# Patient Record
Sex: Female | Born: 1975 | Race: White | Hispanic: No | Marital: Married | Smoking: Current every day smoker
Health system: Southern US, Community
[De-identification: ages and names within clinical notes are randomized; demographics above are authoritative.]

## PROBLEM LIST (undated history)

## (undated) DIAGNOSIS — M549 Dorsalgia, unspecified: Secondary | ICD-10-CM

## (undated) DIAGNOSIS — E119 Type 2 diabetes mellitus without complications: Secondary | ICD-10-CM

## (undated) DIAGNOSIS — F32A Depression, unspecified: Secondary | ICD-10-CM

## (undated) DIAGNOSIS — M7989 Other specified soft tissue disorders: Secondary | ICD-10-CM

## (undated) DIAGNOSIS — M2550 Pain in unspecified joint: Secondary | ICD-10-CM

## (undated) DIAGNOSIS — E559 Vitamin D deficiency, unspecified: Secondary | ICD-10-CM

## (undated) DIAGNOSIS — G4730 Sleep apnea, unspecified: Secondary | ICD-10-CM

## (undated) DIAGNOSIS — K5900 Constipation, unspecified: Secondary | ICD-10-CM

## (undated) DIAGNOSIS — K219 Gastro-esophageal reflux disease without esophagitis: Secondary | ICD-10-CM

## (undated) DIAGNOSIS — Z720 Tobacco use: Secondary | ICD-10-CM

## (undated) DIAGNOSIS — E7800 Pure hypercholesterolemia, unspecified: Secondary | ICD-10-CM

## (undated) DIAGNOSIS — J45909 Unspecified asthma, uncomplicated: Secondary | ICD-10-CM

## (undated) DIAGNOSIS — R079 Chest pain, unspecified: Secondary | ICD-10-CM

## (undated) DIAGNOSIS — F419 Anxiety disorder, unspecified: Secondary | ICD-10-CM

## (undated) DIAGNOSIS — I10 Essential (primary) hypertension: Secondary | ICD-10-CM

## (undated) HISTORY — DX: Depression, unspecified: F32.A

## (undated) HISTORY — DX: Sleep apnea, unspecified: G47.30

## (undated) HISTORY — DX: Dorsalgia, unspecified: M54.9

## (undated) HISTORY — DX: Gastro-esophageal reflux disease without esophagitis: K21.9

## (undated) HISTORY — DX: Constipation, unspecified: K59.00

## (undated) HISTORY — DX: Type 2 diabetes mellitus without complications: E11.9

## (undated) HISTORY — DX: Vitamin D deficiency, unspecified: E55.9

## (undated) HISTORY — PX: APPENDECTOMY: SHX54

## (undated) HISTORY — PX: TUBAL LIGATION: SHX77

## (undated) HISTORY — DX: Tobacco use: Z72.0

## (undated) HISTORY — DX: Other specified soft tissue disorders: M79.89

## (undated) HISTORY — DX: Anxiety disorder, unspecified: F41.9

## (undated) HISTORY — PX: CHOLECYSTECTOMY: SHX55

## (undated) HISTORY — DX: Chest pain, unspecified: R07.9

## (undated) HISTORY — DX: Essential (primary) hypertension: I10

## (undated) HISTORY — DX: Pure hypercholesterolemia, unspecified: E78.00

## (undated) HISTORY — DX: Pain in unspecified joint: M25.50

---

## 2001-05-22 ENCOUNTER — Ambulatory Visit (HOSPITAL_COMMUNITY): Admission: RE | Admit: 2001-05-22 | Discharge: 2001-05-22 | Payer: Self-pay | Admitting: Family Medicine

## 2001-05-22 ENCOUNTER — Encounter: Payer: Self-pay | Admitting: Family Medicine

## 2001-12-08 ENCOUNTER — Ambulatory Visit (HOSPITAL_COMMUNITY): Admission: RE | Admit: 2001-12-08 | Discharge: 2001-12-08 | Payer: Self-pay | Admitting: Family Medicine

## 2001-12-08 ENCOUNTER — Encounter: Payer: Self-pay | Admitting: Family Medicine

## 2001-12-28 ENCOUNTER — Ambulatory Visit (HOSPITAL_COMMUNITY): Admission: RE | Admit: 2001-12-28 | Discharge: 2001-12-28 | Payer: Self-pay | Admitting: Family Medicine

## 2001-12-28 ENCOUNTER — Encounter: Payer: Self-pay | Admitting: Family Medicine

## 2002-10-23 ENCOUNTER — Emergency Department (HOSPITAL_COMMUNITY): Admission: EM | Admit: 2002-10-23 | Discharge: 2002-10-24 | Payer: Self-pay | Admitting: *Deleted

## 2002-10-26 ENCOUNTER — Encounter: Payer: Self-pay | Admitting: Family Medicine

## 2002-10-26 ENCOUNTER — Ambulatory Visit (HOSPITAL_COMMUNITY): Admission: RE | Admit: 2002-10-26 | Discharge: 2002-10-26 | Payer: Self-pay | Admitting: Family Medicine

## 2002-11-06 ENCOUNTER — Encounter (HOSPITAL_COMMUNITY): Admission: RE | Admit: 2002-11-06 | Discharge: 2002-12-06 | Payer: Self-pay | Admitting: Neurological Surgery

## 2004-06-17 ENCOUNTER — Ambulatory Visit (HOSPITAL_COMMUNITY): Admission: RE | Admit: 2004-06-17 | Discharge: 2004-06-17 | Payer: Self-pay | Admitting: Family Medicine

## 2004-07-12 ENCOUNTER — Emergency Department (HOSPITAL_COMMUNITY): Admission: EM | Admit: 2004-07-12 | Discharge: 2004-07-13 | Payer: Self-pay | Admitting: Emergency Medicine

## 2005-06-30 ENCOUNTER — Emergency Department (HOSPITAL_COMMUNITY): Admission: EM | Admit: 2005-06-30 | Discharge: 2005-06-30 | Payer: Self-pay | Admitting: Emergency Medicine

## 2007-01-08 ENCOUNTER — Ambulatory Visit: Payer: Self-pay | Admitting: Certified Nurse Midwife

## 2007-01-08 ENCOUNTER — Inpatient Hospital Stay (HOSPITAL_COMMUNITY): Admission: AD | Admit: 2007-01-08 | Discharge: 2007-01-09 | Payer: Self-pay | Admitting: Obstetrics & Gynecology

## 2007-01-26 ENCOUNTER — Ambulatory Visit (HOSPITAL_COMMUNITY): Admission: AD | Admit: 2007-01-26 | Discharge: 2007-01-27 | Payer: Self-pay | Admitting: Obstetrics and Gynecology

## 2009-09-30 ENCOUNTER — Ambulatory Visit: Admission: RE | Admit: 2009-09-30 | Discharge: 2009-09-30 | Payer: Self-pay | Admitting: Family Medicine

## 2010-03-11 ENCOUNTER — Emergency Department (HOSPITAL_COMMUNITY): Admission: EM | Admit: 2010-03-11 | Discharge: 2010-03-12 | Payer: Self-pay | Admitting: Emergency Medicine

## 2010-11-22 LAB — CBC
MCV: 96 fL (ref 78.0–100.0)
Platelets: 308 10*3/uL (ref 150–400)
RDW: 12.9 % (ref 11.5–15.5)
WBC: 11.7 10*3/uL — ABNORMAL HIGH (ref 4.0–10.5)

## 2010-11-22 LAB — DIFFERENTIAL
Basophils Absolute: 0.1 10*3/uL (ref 0.0–0.1)
Eosinophils Relative: 4 % (ref 0–5)
Lymphocytes Relative: 24 % (ref 12–46)
Neutrophils Relative %: 65 % (ref 43–77)

## 2010-11-22 LAB — BASIC METABOLIC PANEL
BUN: 8 mg/dL (ref 6–23)
Calcium: 9.1 mg/dL (ref 8.4–10.5)
Creatinine, Ser: 0.65 mg/dL (ref 0.4–1.2)
GFR calc non Af Amer: >60 mL/min (ref >60)

## 2010-11-22 LAB — POCT CARDIAC MARKERS: CKMB, poc: 1.3 ng/mL (ref 1.0–8.0)

## 2010-11-22 LAB — D-DIMER, QUANTITATIVE: D-Dimer, Quant: 0.22 ug/mL-FEU (ref 0.00–0.48)

## 2012-09-07 ENCOUNTER — Emergency Department (HOSPITAL_COMMUNITY)
Admission: EM | Admit: 2012-09-07 | Discharge: 2012-09-07 | Disposition: A | Discharge status: Home / Self Care | Payer: 59 | Attending: Emergency Medicine | Admitting: Emergency Medicine

## 2012-09-07 ENCOUNTER — Encounter (HOSPITAL_COMMUNITY): Payer: Self-pay | Admitting: *Deleted

## 2012-09-07 ENCOUNTER — Emergency Department (HOSPITAL_COMMUNITY): Payer: 59

## 2012-09-07 DIAGNOSIS — M79671 Pain in right foot: Secondary | ICD-10-CM

## 2012-09-07 DIAGNOSIS — J45909 Unspecified asthma, uncomplicated: Secondary | ICD-10-CM | POA: Insufficient documentation

## 2012-09-07 DIAGNOSIS — M25579 Pain in unspecified ankle and joints of unspecified foot: Secondary | ICD-10-CM | POA: Insufficient documentation

## 2012-09-07 DIAGNOSIS — F17200 Nicotine dependence, unspecified, uncomplicated: Secondary | ICD-10-CM | POA: Insufficient documentation

## 2012-09-07 HISTORY — DX: Unspecified asthma, uncomplicated: J45.909

## 2012-09-07 MED ORDER — IBUPROFEN 800 MG PO TABS
800.0000 mg | ORAL_TABLET | Freq: Once | ORAL | Status: AC
  Administered 2012-09-07: 800 mg via ORAL
  Filled 2012-09-07: qty 1

## 2012-09-07 MED ORDER — IBUPROFEN 600 MG PO TABS: 600.0000 mg | ORAL_TABLET | Freq: Four times a day (QID) | ORAL | Status: DC | PRN

## 2012-09-07 NOTE — ED Notes (Signed)
Pain rt foot ,no known injury, Increased pain with wt bearing.

## 2012-09-07 NOTE — ED Provider Notes (Signed)
Medical screening examination/treatment/procedure(s) were performed by non-physician practitioner and as supervising physician I was immediately available for consultation/collaboration. Bexley Mclester, MD, FACEP   Leeasia Secrist L Lauralye Kinn, MD 09/07/12 2345 

## 2012-09-07 NOTE — ED Provider Notes (Signed)
History     CSN: [id number]  Arrival date & time 09/07/12  2156   First MD Initiated Contact with Patient 09/07/12 2211      Chief Complaint  Patient presents with  . Foot Pain    (Consider location/radiation/quality/duration/timing/severity/associated sxs/prior treatment) HPI Comments: Vanessa Shelton presents with a 1 day history of right foot pain located at her instep,  Described as constant,  nonradiating and burning in character,  Worse with weight bearing and palpation.  She denies injury.  She does stand on her feet all day with her job,  And started wearing a new pair of tennis shoe about 2 weeks ago ,  But are comfortable and does not feel this is the problem.  She has taken no medicines for her pain today,  But did try to rest and keep it elevated last night which did not help.    The history is provided by the patient.    Past Medical History  Diagnosis Date  . Asthma     Past Surgical History  Procedure Date  . Appendectomy   . Cholecystectomy   . Tubal ligation     History reviewed. No pertinent family history.  History  Substance Use Topics  . Smoking status: Current Every Day Smoker  . Smokeless tobacco: Not on file  . Alcohol Use: No    OB History    Grav Para Term Preterm Abortions TAB SAB Ect Mult Living                  Review of Systems  Musculoskeletal: Positive for arthralgias. Negative for joint swelling.  Skin: Negative for wound.  Neurological: Negative for weakness and numbness.    Allergies  Bee venom; Penicillins; and Latex  Home Medications   Current Outpatient Rx  Name  Route  Sig  Dispense  Refill  . IBUPROFEN 600 MG PO TABS   Oral   Take 1 tablet (600 mg total) by mouth every 6 (six) hours as needed for pain.   30 tablet   0     BP 151/76  Pulse 88  Temp 97.9 F (36.6 C) (Oral)  Resp 24  Ht 5\' 4"  (1.626 m)  Wt 225 lb 6 oz (102.229 kg)  BMI 38.69 kg/m2  SpO2 99%  LMP 08/11/2012  Physical Exam  Constitutional:  She appears well-developed and well-nourished.  HENT:  Head: Atraumatic.  Neck: Normal range of motion.  Musculoskeletal: She exhibits tenderness.       Feet:       Localized ttp right instep,  Faint ecchymosis at site.  Distal sensation intact with less than 3 sec cap refill.  Pedal pulses intact.  Neurological: She is alert. She has normal strength. She displays normal reflexes. No sensory deficit.       Equal strength  Skin: Skin is warm and dry.  Psychiatric: She has a normal mood and affect.    ED Course  Procedures (including critical care time)  Labs Reviewed - No data to display Dg Foot Complete Right  09/07/2012  *RADIOLOGY REPORT*  Clinical Data: Pain and swelling at the arch of the right foot.  RIGHT FOOT COMPLETE - 3+ VIEW  Comparison: None.  Findings: There is no evidence of fracture or dislocation.  The joint spaces are preserved.  There is no evidence of talar subluxation; the subtalar joint is unremarkable in appearance.  No significant soft tissue abnormalities are seen.  IMPRESSION: No evidence of fracture or dislocation.  Original Report Authenticated By: Tonia Ghent, M.D.      1. Right foot pain       MDM  Ibuprofen, ice/elevation.  Ace wrap provided.  Recheck by pcp if not improved over the next week.        Burgess Amor, [place] 09/07/12 2259

## 2012-12-12 ENCOUNTER — Encounter: Payer: Self-pay | Admitting: *Deleted

## 2012-12-14 ENCOUNTER — Encounter: Payer: Self-pay | Admitting: Nurse Practitioner

## 2012-12-14 ENCOUNTER — Other Ambulatory Visit: Payer: Self-pay | Admitting: Nurse Practitioner

## 2012-12-14 ENCOUNTER — Ambulatory Visit (INDEPENDENT_AMBULATORY_CARE_PROVIDER_SITE_OTHER): Payer: 59 | Admitting: Nurse Practitioner

## 2012-12-14 VITALS — BP 130/80 | HR 80 | Ht 64.5 in | Wt 234.2 lb

## 2012-12-14 DIAGNOSIS — Z13220 Encounter for screening for lipoid disorders: Secondary | ICD-10-CM

## 2012-12-14 DIAGNOSIS — Z01419 Encounter for gynecological examination (general) (routine) without abnormal findings: Secondary | ICD-10-CM

## 2012-12-14 DIAGNOSIS — R5381 Other malaise: Secondary | ICD-10-CM

## 2012-12-14 DIAGNOSIS — Z0000 Encounter for general adult medical examination without abnormal findings: Secondary | ICD-10-CM

## 2012-12-14 DIAGNOSIS — Z124 Encounter for screening for malignant neoplasm of cervix: Secondary | ICD-10-CM

## 2012-12-14 DIAGNOSIS — R5383 Other fatigue: Secondary | ICD-10-CM

## 2012-12-14 LAB — HEPATIC FUNCTION PANEL
ALT: 45 U/L — ABNORMAL HIGH (ref 0–35)
Bilirubin, Direct: 0.1 mg/dL (ref 0.0–0.3)
Total Protein: 6.9 g/dL (ref 6.0–8.3)

## 2012-12-14 LAB — BASIC METABOLIC PANEL
BUN: 10 mg/dL (ref 6–23)
CO2: 27 mEq/L (ref 19–32)
Chloride: 104 mEq/L (ref 96–112)
Creat: 0.59 mg/dL (ref 0.50–1.10)

## 2012-12-14 LAB — LIPID PANEL
Cholesterol: 156 mg/dL (ref 0–200)
Triglycerides: 96 mg/dL (ref <150)

## 2012-12-15 LAB — PAP IG W/ RFLX HPV ASCU

## 2012-12-16 ENCOUNTER — Encounter: Payer: Self-pay | Admitting: Nurse Practitioner

## 2012-12-16 ENCOUNTER — Telehealth: Payer: Self-pay | Admitting: Nurse Practitioner

## 2012-12-16 DIAGNOSIS — R5383 Other fatigue: Secondary | ICD-10-CM | POA: Insufficient documentation

## 2012-12-16 DIAGNOSIS — R0683 Snoring: Secondary | ICD-10-CM

## 2012-12-16 DIAGNOSIS — Z01419 Encounter for gynecological examination (general) (routine) without abnormal findings: Secondary | ICD-10-CM | POA: Insufficient documentation

## 2012-12-16 MED ORDER — VITAMIN D (ERGOCALCIFEROL) 1.25 MG (50000 UNIT) PO CAPS: 50000.0000 [IU] | ORAL_CAPSULE | ORAL | Status: DC

## 2012-12-16 NOTE — Progress Notes (Signed)
Subjective:  Presents for her preventive health physical. Gets regular eye exams. Has had endometrial ablation but continues to have regular menses with clotting at times slightly heavy flow lasting 5-7 days. Some pain. No vaginal discharge. Married, same sexual partner. Very active job but otherwise no exercise. Has excessive fatigue during the daytime, husband says she has very loud snoring with gasping at times. Patient quit smoking in January, has gained some weight since then.  Objective:   BP 130/80  Pulse 80  Ht 5' 4.5" (1.638 m)  Wt 234 lb 4 oz (106.255 kg)  BMI 39.6 kg/m2 NAD. Alert, oriented. Mildly fatigued in appearance. TMs mild clear effusion, no erythema. Pharynx clear. Neck supple with minimal adenopathy. Thyroid normal limit to palpation. Lungs clear. Heart regular rate rhythm. Breast exam normal limit, axilla no adenopathy. Abdomen soft nondistended nontender. EGBUS normal. Vagina normal limit in appearance. Pap smear obtained. Bimanual exam no tenderness, no masses. Limited exam due to abdominal girth.

## 2012-12-16 NOTE — Assessment & Plan Note (Signed)
With patient's weight gain, excessive fatigue and history of snoring, will set up study for OSA.

## 2012-12-16 NOTE — Telephone Encounter (Signed)
Vitamin D level low on recent labs.  Sleep study ordered see note 12/14/2012

## 2012-12-16 NOTE — Assessment & Plan Note (Signed)
Discussed importance of healthy diet and weight loss. Continued encouragement for smoking cessation efforts. Routine lab work ordered. Pap smear pending. Recommend vitamin D and calcium supplementation.

## 2012-12-22 ENCOUNTER — Telehealth: Payer: Self-pay | Admitting: Family Medicine

## 2012-12-22 NOTE — Telephone Encounter (Signed)
I apologize.  I remember her telling me this.  Please refer to [facility] for new CPAP.  Orders need to be for:  Variable CPAP with humidified H2O 5-20cm. Mask/tubing of choice.  Diagnosis:  OSA.  Please fax order and copy of sleep study to [facility] at [phone]

## 2012-12-22 NOTE — Telephone Encounter (Signed)
Patient states you told her we would work on getting her an auto titrate CPAP machine with new tubing and possibly some sort of Nasal Pillow instead of a mask.  Then she got a call from [facility] about getting a new sleep study.  She just had a sleep study & got her CPAP machine in 2011.  She's confused as to need for new sleep study, unsure insurance will cover new test so soon, please advise.  We may leave a message on her cell# [phone] or call her at work [phone]

## 2012-12-25 NOTE — Telephone Encounter (Signed)
Order and copy of sleep study faxed to number listed below. Patient notified.

## 2012-12-26 ENCOUNTER — Telehealth: Payer: Self-pay | Admitting: Family Medicine

## 2012-12-26 NOTE — Telephone Encounter (Signed)
[facility] sent a fax explaining that they can't provide patient with auto CPAP due to being out of network with patient's insurance.  I called [facility] and that's where patient has current CPAP through, they requested me fax the order and they will contact patient.

## 2013-01-04 ENCOUNTER — Telehealth: Payer: Self-pay | Admitting: *Deleted

## 2013-01-04 NOTE — Telephone Encounter (Signed)
Left message to Pt via vm regarding [facility] home sleep study. Attempts by [facility] to contact pt several times, no answer or call back.

## 2013-07-12 ENCOUNTER — Other Ambulatory Visit: Payer: Self-pay

## 2013-08-03 ENCOUNTER — Encounter: Payer: Self-pay | Admitting: Family Medicine

## 2013-08-03 ENCOUNTER — Ambulatory Visit (INDEPENDENT_AMBULATORY_CARE_PROVIDER_SITE_OTHER): Payer: 59 | Admitting: Family Medicine

## 2013-08-03 ENCOUNTER — Telehealth: Payer: Self-pay | Admitting: Family Medicine

## 2013-08-03 VITALS — BP 142/90 | Temp 98.4°F | Ht 64.0 in | Wt 229.4 lb

## 2013-08-03 DIAGNOSIS — J209 Acute bronchitis, unspecified: Secondary | ICD-10-CM

## 2013-08-03 MED ORDER — ALBUTEROL SULFATE HFA 108 (90 BASE) MCG/ACT IN AERS: 2.0000 | INHALATION_SPRAY | Freq: Four times a day (QID) | RESPIRATORY_TRACT | Status: DC | PRN

## 2013-08-03 MED ORDER — AZITHROMYCIN 250 MG PO TABS: ORAL_TABLET | ORAL | Status: DC

## 2013-08-03 NOTE — Telephone Encounter (Signed)
Inhalers are all extremely pricey thanks to insur companies--written in a way that they can do any generic albuterol, call [facility] and ask if cheaper alternative

## 2013-08-03 NOTE — Telephone Encounter (Signed)
Spoke with [facility]-There is no cheaper alternative. Patient notified.

## 2013-08-03 NOTE — Telephone Encounter (Signed)
Patient seen here this afternoon, inhaler you wrote for her is $50.00 with her copay on her insurance, can't afford, can we call in something cheaper?  Please advise Pt uses [facility], please call pt [phone]

## 2013-08-03 NOTE — Progress Notes (Signed)
   Subjective:    Patient ID: Vanessa Shelton, female    DOB: 1975-11-12, 37 y.o.   MRN: [id number]  Cough This is a new problem. The current episode started 1 to 4 weeks ago. The problem occurs every few minutes. The cough is productive of brown sputum and productive of blood-tinged sputum. Associated symptoms include chest pain, ear pain, headaches, myalgias, postnasal drip and rhinorrhea. She has tried OTC cough suppressant for the symptoms. The treatment provided no relief.   cou Cough has been going on for two wks, brownish color  Productive cough, no notieable fever  Feeling tight, and low back pain with cough  Pt quit in [place], back to it in the summer  Review of Systems  HENT: Positive for ear pain, postnasal drip and rhinorrhea.   Respiratory: Positive for cough.   Cardiovascular: Positive for chest pain.  Musculoskeletal: Positive for myalgias.  Neurological: Positive for headaches.       Objective:   Physical Exam Alert HEENT moderate nasal congestion. Some malaise. Blood pressure good on repeat. Vital stable. Pharynx slight erythema lungs mild wheezes some rhonchi no tachypnea no crackles heart regular rate and rhythm       Assessment & Plan:  Impression acute bronchitis with reactive airways. Plan appropriate antibiotics. Albuterol when necessary. Symptomatic care discussed. WSL

## 2013-11-01 ENCOUNTER — Telehealth: Payer: Self-pay | Admitting: Family Medicine

## 2013-11-01 ENCOUNTER — Other Ambulatory Visit: Payer: Self-pay | Admitting: Family Medicine

## 2013-11-01 MED ORDER — SULFACETAMIDE SODIUM 10 % OP SOLN: 2.0000 [drp] | Freq: Four times a day (QID) | OPHTHALMIC | Status: DC

## 2013-11-01 NOTE — Telephone Encounter (Signed)
Pt called, eye itchy, green d/c last pm, clear today, no injury, no fever, [facility], pt will get filled if Sx worse, pt to call if not better within a few days, aslo if worse to call

## 2014-01-06 ENCOUNTER — Emergency Department (HOSPITAL_COMMUNITY)
Admission: EM | Admit: 2014-01-06 | Discharge: 2014-01-06 | Disposition: A | Discharge status: Home / Self Care | Payer: 59 | Attending: Emergency Medicine | Admitting: Emergency Medicine

## 2014-01-06 ENCOUNTER — Encounter (HOSPITAL_COMMUNITY): Payer: Self-pay | Admitting: Emergency Medicine

## 2014-01-06 DIAGNOSIS — J45909 Unspecified asthma, uncomplicated: Secondary | ICD-10-CM | POA: Insufficient documentation

## 2014-01-06 DIAGNOSIS — F17200 Nicotine dependence, unspecified, uncomplicated: Secondary | ICD-10-CM | POA: Insufficient documentation

## 2014-01-06 DIAGNOSIS — Z79899 Other long term (current) drug therapy: Secondary | ICD-10-CM | POA: Insufficient documentation

## 2014-01-06 DIAGNOSIS — Z91040 Latex allergy status: Secondary | ICD-10-CM | POA: Insufficient documentation

## 2014-01-06 DIAGNOSIS — R519 Headache, unspecified: Secondary | ICD-10-CM

## 2014-01-06 DIAGNOSIS — Z792 Long term (current) use of antibiotics: Secondary | ICD-10-CM | POA: Insufficient documentation

## 2014-01-06 DIAGNOSIS — R51 Headache: Secondary | ICD-10-CM | POA: Insufficient documentation

## 2014-01-06 DIAGNOSIS — Z880 Allergy status to penicillin: Secondary | ICD-10-CM | POA: Insufficient documentation

## 2014-01-06 MED ORDER — KETOROLAC TROMETHAMINE 10 MG PO TABS
10.0000 mg | ORAL_TABLET | Freq: Once | ORAL | Status: AC
  Administered 2014-01-06: 10 mg via ORAL
  Filled 2014-01-06: qty 1

## 2014-01-06 MED ORDER — KETOROLAC TROMETHAMINE 10 MG PO TABS: 10.0000 mg | ORAL_TABLET | Freq: Three times a day (TID) | ORAL | Status: DC

## 2014-01-06 MED ORDER — HYDROCODONE-ACETAMINOPHEN 5-325 MG PO TABS: 1.0000 | ORAL_TABLET | ORAL | Status: DC | PRN

## 2014-01-06 MED ORDER — HYDROCODONE-ACETAMINOPHEN 5-325 MG PO TABS
2.0000 | ORAL_TABLET | Freq: Once | ORAL | Status: AC
  Administered 2014-01-06: 2 via ORAL
  Filled 2014-01-06: qty 2

## 2014-01-06 MED ORDER — CLINDAMYCIN HCL 150 MG PO CAPS
300.0000 mg | ORAL_CAPSULE | Freq: Once | ORAL | Status: AC
  Administered 2014-01-06: 300 mg via ORAL
  Filled 2014-01-06: qty 2

## 2014-01-06 MED ORDER — CLINDAMYCIN HCL 150 MG PO CAPS: 150.0000 mg | ORAL_CAPSULE | Freq: Three times a day (TID) | ORAL | Status: DC

## 2014-01-06 NOTE — Discharge Instructions (Signed)
Your vital signs and electrocardiogram are all normal. I suspect that you are having a problem with your sinuses, or with your dental status. Please see your dentist as sone as possible for evaluation. Please use clindamycin and Toradol daily. Please take these with food. May use Norco for pain if needed. This medication may cause drowsiness, please use with caution.

## 2014-01-06 NOTE — ED Provider Notes (Signed)
CSN: [id number]     Arrival date & time 01/06/14  [id number] History   First MD Initiated Contact with Patient 01/06/14 0848     Chief Complaint  Patient presents with  . Jaw Pain     (Consider location/radiation/quality/duration/timing/severity/associated sxs/prior Treatment) HPI Comments: Pt states she was awaken with right side face pain. No reported injury to the right face. No known insect bite. No recent procedure or surgery involving the face. No reported sinus issues. No reported fever or chills. Pt does c/o nausea related to the pain. She has not had this pain before. No c/o chest pain or SOB related to the face pain. She took tylenol at 3AM and 6AM without relief.  The history is provided by the patient.    Past Medical History  Diagnosis Date  . Asthma    Past Surgical History  Procedure Laterality Date  . Appendectomy    . Cholecystectomy    . Tubal ligation     Family History  Problem Relation Age of Onset  . Hypertension Maternal Grandmother   . Heart attack Maternal Grandfather    History  Substance Use Topics  . Smoking status: Current Every Day Smoker  . Smokeless tobacco: Not on file  . Alcohol Use: No   OB History   Grav Para Term Preterm Abortions TAB SAB Ect Mult Living                 Review of Systems  Constitutional: Negative for activity change.       All ROS Neg except as noted in HPI  HENT: Negative for nosebleeds.   Eyes: Negative for photophobia and discharge.  Respiratory: Negative for cough, shortness of breath and wheezing.   Cardiovascular: Negative for chest pain and palpitations.  Gastrointestinal: Negative for abdominal pain and blood in stool.  Genitourinary: Negative for dysuria, frequency and hematuria.  Musculoskeletal: Negative for arthralgias, back pain and neck pain.  Skin: Negative.   Neurological: Negative for dizziness, seizures and speech difficulty.  Psychiatric/Behavioral: Negative for hallucinations and confusion.       Allergies  Bee venom; Penicillins; and Latex  Home Medications   Prior to Admission medications   Medication Sig Start Date End Date Taking? Authorizing Provider  albuterol (PROVENTIL HFA;VENTOLIN HFA) 108 (90 BASE) MCG/ACT inhaler Inhale 2 puffs into the lungs every 6 (six) hours as needed for wheezing or shortness of breath. 08/03/13   Merlyn AlbertWilliam S Luking, MD  azithromycin (ZITHROMAX Z-PAK) 250 MG tablet Take 2 tablets (500 mg) on  Day 1,  followed by 1 tablet (250 mg) once daily on Days 2 through 5. 08/03/13   Merlyn AlbertWilliam S Luking, MD  Homeopathic Products (ALLERGY MEDICINE PO) Take by mouth daily.    Historical Provider, MD  ibuprofen (ADVIL,MOTRIN) 600 MG tablet Take 1 tablet (600 mg total) by mouth every 6 (six) hours as needed for pain. 09/07/12   Burgess AmorJulie Idol, PA-C  sulfacetamide (BLEPH-10) 10 % ophthalmic solution Place 2 drops into the left eye 4 (four) times daily. 11/01/13   Babs SciaraScott A Luking, MD  Vitamin D, Ergocalciferol, (DRISDOL) 50000 UNITS CAPS Take 1 capsule (50,000 Units total) by mouth every 7 (seven) days. 12/16/12   Campbell Richesarolyn C Hoskins, NP   BP 132/78  Pulse 85  Temp(Src) 97.9 F (36.6 C) (Oral)  Resp 18  Ht 5\' 3"  (1.6 m)  Wt 229 lb (103.874 kg)  BMI 40.58 kg/m2  SpO2 97%  LMP 01/06/2014 Physical Exam  Nursing note and vitals reviewed.  Constitutional: She is oriented to person, place, and time. She appears well-developed and well-nourished.  Non-toxic appearance.  HENT:  Head: Normocephalic.  Right Ear: Tympanic membrane and external ear normal.  Left Ear: Tympanic membrane and external ear normal.  There is no significant swelling of the face. The patient head are symmetrical. There is mild tenderness to percussion over the right maxillary sinus.  It is also pain to percussion of the right lower jaw. No rash noted of the face.  There is significant gum disease present. There is no visible abscess. The airway is patent. The uvula is in the midline. There is no swelling  under the tongue. No unusual rash in the mouth.  Eyes: EOM and lids are normal. Pupils are equal, round, and reactive to light.  Neck: Normal range of motion. Neck supple. Carotid bruit is not present.  Cardiovascular: Normal rate, regular rhythm, normal heart sounds, intact distal pulses and normal pulses.  Exam reveals no gallop and no friction rub.   No murmur heard. Pulmonary/Chest: Breath sounds normal. No respiratory distress. She has no wheezes. She has no rales.  Abdominal: Soft. Bowel sounds are normal. There is no tenderness. There is no guarding.  Musculoskeletal: Normal range of motion. She exhibits no edema.  Lymphadenopathy:       Head (right side): No submandibular adenopathy present.       Head (left side): No submandibular adenopathy present.    She has no cervical adenopathy.  Neurological: She is alert and oriented to person, place, and time. She has normal strength. No cranial nerve deficit or sensory deficit.  Skin: Skin is warm and dry.  Psychiatric: She has a normal mood and affect. Her speech is normal.    ED Course  Procedures (including critical care time) Labs Review Labs Reviewed - No data to display  Imaging Review No results found.   EKG Interpretation None      MDM Vital signs are well within normal limits. The pulse oximetry is 97% on room air. Within normal limits by my interpretation. Electrocardiogram shows a normal sinus rhythm. No acute event at this time.  I suspect the patient is a has a sinus related condition, or problems with her dentition. Patient will be placed on clindamycin, Toradol, and Norco. Patient strongly advised to see a dentist as sone as possible for evaluation of this facial pain. Patient is invited to return to the emergency department if any changes or deterioration in condition.    Final diagnoses:  None    **I have reviewed nursing notes, vital signs, and all appropriate lab and imaging results for this  patient.Kathie Dike*    Alyxandra Tenbrink M Solae Norling, PA-C 01/06/14 1035

## 2014-01-06 NOTE — ED Notes (Signed)
Pt reports woke up at 3 am with pain and swelling to r side of face.  Took 1 tylenol 3 at 3am and 1 at 6am.

## 2014-01-08 NOTE — ED Provider Notes (Signed)
Medical screening examination/treatment/procedure(s) were performed by non-physician practitioner and as supervising physician I was immediately available for consultation/collaboration.   EKG Interpretation   Date/Time:  Sunday Jan 06 2014 10:14:32 EDT Ventricular Rate:  64 PR Interval:  164 QRS Duration: 82 QT Interval:  404 QTC Calculation: 416 R Axis:   64 Text Interpretation:  Normal sinus rhythm with sinus arrhythmia Normal ECG  ED PHYSICIAN INTERPRETATION AVAILABLE IN [facility] Confirmed by  TEST, Record ([id number]) on 01/08/2014 7:24:21 AM       Juliet RudeNathan R. Rubin PayorPickering, MD 01/08/14 1208

## 2014-09-23 ENCOUNTER — Other Ambulatory Visit (HOSPITAL_COMMUNITY): Payer: Self-pay | Admitting: Nephrology

## 2014-09-23 ENCOUNTER — Ambulatory Visit (HOSPITAL_COMMUNITY)
Admission: RE | Admit: 2014-09-23 | Discharge: 2014-09-23 | Disposition: A | Discharge status: Home / Self Care | Payer: Managed Care, Other (non HMO) | Source: Ambulatory Visit | Attending: Nephrology | Admitting: Nephrology

## 2014-09-23 DIAGNOSIS — A150 Tuberculosis of lung: Secondary | ICD-10-CM

## 2015-02-05 ENCOUNTER — Encounter (HOSPITAL_COMMUNITY): Payer: Self-pay | Admitting: Emergency Medicine

## 2015-02-05 ENCOUNTER — Emergency Department (HOSPITAL_COMMUNITY)
Admission: EM | Admit: 2015-02-05 | Discharge: 2015-02-05 | Disposition: A | Discharge status: Home / Self Care | Payer: Managed Care, Other (non HMO) | Attending: Emergency Medicine | Admitting: Emergency Medicine

## 2015-02-05 DIAGNOSIS — W57XXXA Bitten or stung by nonvenomous insect and other nonvenomous arthropods, initial encounter: Secondary | ICD-10-CM | POA: Insufficient documentation

## 2015-02-05 DIAGNOSIS — S80261A Insect bite (nonvenomous), right knee, initial encounter: Secondary | ICD-10-CM | POA: Diagnosis present

## 2015-02-05 DIAGNOSIS — Z720 Tobacco use: Secondary | ICD-10-CM | POA: Diagnosis not present

## 2015-02-05 DIAGNOSIS — Y9289 Other specified places as the place of occurrence of the external cause: Secondary | ICD-10-CM | POA: Insufficient documentation

## 2015-02-05 DIAGNOSIS — Z91040 Latex allergy status: Secondary | ICD-10-CM | POA: Diagnosis not present

## 2015-02-05 DIAGNOSIS — Z880 Allergy status to penicillin: Secondary | ICD-10-CM | POA: Diagnosis not present

## 2015-02-05 DIAGNOSIS — Z792 Long term (current) use of antibiotics: Secondary | ICD-10-CM | POA: Diagnosis not present

## 2015-02-05 DIAGNOSIS — J45909 Unspecified asthma, uncomplicated: Secondary | ICD-10-CM | POA: Diagnosis not present

## 2015-02-05 DIAGNOSIS — Y9389 Activity, other specified: Secondary | ICD-10-CM | POA: Insufficient documentation

## 2015-02-05 DIAGNOSIS — Y998 Other external cause status: Secondary | ICD-10-CM | POA: Diagnosis not present

## 2015-02-05 DIAGNOSIS — Z791 Long term (current) use of non-steroidal anti-inflammatories (NSAID): Secondary | ICD-10-CM | POA: Insufficient documentation

## 2015-02-05 MED ORDER — TRIAMCINOLONE ACETONIDE 0.1 % EX CREA: 1.0000 "application " | TOPICAL_CREAM | Freq: Two times a day (BID) | CUTANEOUS | Status: DC

## 2015-02-05 MED ORDER — SULFAMETHOXAZOLE-TRIMETHOPRIM 800-160 MG PO TABS: 1.0000 | ORAL_TABLET | Freq: Two times a day (BID) | ORAL | Status: AC

## 2015-02-05 NOTE — Discharge Instructions (Signed)
Please soak in a double warm water for 10-15 minutes daily until the infected area under the knee resolves. Please use Septra 2 times daily with food. Please apply triamcinolone cream 2 times daily to assist with itching. May also use Benadryl for itching if needed at bedtime. Please see your primary physician, or return to the emergency department if it seems that the infection is advancing.

## 2015-02-05 NOTE — ED Provider Notes (Signed)
CSN: [id number]     Arrival date & time 02/05/15  1812 History   None    Chief Complaint  Patient presents with  . Insect Bite     (Consider location/radiation/quality/duration/timing/severity/associated sxs/prior Treatment) HPI Comments: Patient is a 39 year old female who states that approximately 2 or 3 days ago she sustained a bite to the area behind the right knee. She thinks that it was an insect bite, but she is unsure of that. The patient denies any fever or chills. She's not noticed any drainage from the area, but states that it itches a lot, and at times it actually causes some discomfort. Patient denies diabetes. She denies any medical problems that would cause compromise of her immune system.  The history is provided by the patient.    Past Medical History  Diagnosis Date  . Asthma    Past Surgical History  Procedure Laterality Date  . Appendectomy    . Cholecystectomy    . Tubal ligation     Family History  Problem Relation Age of Onset  . Hypertension Maternal Grandmother   . Heart attack Maternal Grandfather    History  Substance Use Topics  . Smoking status: Current Every Day Smoker -- 0.50 packs/day    Types: Cigarettes  . Smokeless tobacco: Not on file  . Alcohol Use: No   OB History    No data available     Review of Systems  Constitutional: Negative for activity change.       All ROS Neg except as noted in HPI  HENT: Negative for nosebleeds.   Eyes: Negative for photophobia and discharge.  Respiratory: Negative for cough, shortness of breath and wheezing.   Cardiovascular: Negative for chest pain and palpitations.  Gastrointestinal: Negative for abdominal pain and blood in stool.  Genitourinary: Negative for dysuria, frequency and hematuria.  Musculoskeletal: Negative for back pain, arthralgias and neck pain.  Skin: Negative.   Neurological: Negative for dizziness, seizures and speech difficulty.  Psychiatric/Behavioral: Negative for  hallucinations and confusion.      Allergies  Bee venom; Penicillins; and Latex  Home Medications   Prior to Admission medications   Medication Sig Start Date End Date Taking? Authorizing Provider  acetaminophen-codeine (TYLENOL #3) 300-30 MG per tablet Take 2 tablets by mouth daily as needed for moderate pain.    Historical Provider, MD  clindamycin (CLEOCIN) 150 MG capsule Take 1 capsule (150 mg total) by mouth 3 (three) times daily. 01/06/14   Ivery Quale, PA-C  HYDROcodone-acetaminophen (NORCO/VICODIN) 5-325 MG per tablet Take 1 tablet by mouth every 4 (four) hours as needed for moderate pain. 01/06/14   Ivery Quale, PA-C  ketorolac (TORADOL) 10 MG tablet Take 1 tablet (10 mg total) by mouth 3 (three) times daily. 01/06/14   Ivery Quale, PA-C  sulfamethoxazole-trimethoprim (BACTRIM DS,SEPTRA DS) 800-160 MG per tablet Take 1 tablet by mouth 2 (two) times daily. 02/05/15 02/12/15  Ivery Quale, PA-C  triamcinolone cream (KENALOG) 0.1 % Apply 1 application topically 2 (two) times daily. 02/05/15   Ivery Quale, PA-C   BP 148/56 mmHg  Pulse 79  Temp(Src) 98.4 F (36.9 C) (Oral)  Resp 20  Ht  (1.575 m)  Wt 233 lb 1.6 oz (105.733 kg)  BMI 42.62 kg/m2  SpO2 100%  LMP 01/26/2015 Physical Exam  Constitutional: She is oriented to person, place, and time. She appears well-developed and well-nourished.  Non-toxic appearance.  HENT:  Head: Normocephalic.  Right Ear: Tympanic membrane and external ear normal.  Left Ear: Tympanic membrane and external ear normal.  Eyes: EOM and lids are normal. Pupils are equal, round, and reactive to light.  Neck: Normal range of motion. Neck supple. Carotid bruit is not present.  Cardiovascular: Normal rate, regular rhythm, normal heart sounds, intact distal pulses and normal pulses.   Pulmonary/Chest: Breath sounds normal. No respiratory distress.  Abdominal: Soft. Bowel sounds are normal. There is no tenderness. There is no guarding.   Musculoskeletal: Normal range of motion.  There is a raised red area behind the right knee with increase redness present. The area is warm but not hot. There is no red streaks appreciated. There no satellite lesions around the raised red area. The patient has full range of motion of the knee. Dorsalis pedis and posterior tibial pulses are 2+.  Lymphadenopathy:       Head (right side): No submandibular adenopathy present.       Head (left side): No submandibular adenopathy present.    She has no cervical adenopathy.  Neurological: She is alert and oriented to person, place, and time. She has normal strength. No cranial nerve deficit or sensory deficit.  Skin: Skin is warm and dry.  Psychiatric: She has a normal mood and affect. Her speech is normal.  Nursing note and vitals reviewed.   ED Course  Procedures (including critical care time) Labs Review Labs Reviewed - No data to display  Imaging Review No results found.   EKG Interpretation None      MDM  Vital signs are well within normal limits. Patient will use warm tub soaks daily to the infected area. She will apply triamcinolone 2 times daily for itching, she will use Benadryl at bedtime if needed for itching. He'll use Septra DS 2 times daily. The patient is to see her primary physician or return to the emergency department if any signs of advancing infection.    Final diagnoses:  Insect bite    **I have reviewed nursing notes, vital signs, and all appropriate lab and imaging results for this patient.Ivery Quale*    Deetra Booton, PA-C 02/05/15 1948  Bethann BerkshireJoseph Zammit, MD 02/06/15 [phone]

## 2015-02-05 NOTE — ED Notes (Signed)
Insect bite to back of leg right, itching, swelling, red

## 2015-08-27 ENCOUNTER — Ambulatory Visit (INDEPENDENT_AMBULATORY_CARE_PROVIDER_SITE_OTHER): Payer: Managed Care, Other (non HMO) | Admitting: Family Medicine

## 2015-08-27 ENCOUNTER — Encounter: Payer: Self-pay | Admitting: Family Medicine

## 2015-08-27 VITALS — BP 134/94 | Temp 98.2°F | Wt 225.0 lb

## 2015-08-27 DIAGNOSIS — R5383 Other fatigue: Secondary | ICD-10-CM

## 2015-08-27 DIAGNOSIS — R300 Dysuria: Secondary | ICD-10-CM | POA: Diagnosis not present

## 2015-08-27 DIAGNOSIS — M545 Low back pain: Secondary | ICD-10-CM | POA: Diagnosis not present

## 2015-08-27 DIAGNOSIS — G44209 Tension-type headache, unspecified, not intractable: Secondary | ICD-10-CM | POA: Diagnosis not present

## 2015-08-27 DIAGNOSIS — I10 Essential (primary) hypertension: Secondary | ICD-10-CM | POA: Diagnosis not present

## 2015-08-27 LAB — POCT URINALYSIS DIPSTICK
Spec Grav, UA: >=1.03
pH, UA: 5

## 2015-08-27 MED ORDER — SULFAMETHOXAZOLE-TRIMETHOPRIM 800-160 MG PO TABS: 1.0000 | ORAL_TABLET | Freq: Two times a day (BID) | ORAL | Status: DC

## 2015-08-27 MED ORDER — LISINOPRIL 5 MG PO TABS: 5.0000 mg | ORAL_TABLET | Freq: Every day | ORAL | Status: DC

## 2015-08-27 NOTE — Patient Instructions (Signed)
DASH Eating Plan  DASH stands for "Dietary Approaches to Stop Hypertension." The DASH eating plan is a healthy eating plan that has been shown to reduce high blood pressure (hypertension). Additional health benefits may include reducing the risk of type 2 diabetes mellitus, heart disease, and stroke. The DASH eating plan may also help with weight loss.  WHAT DO I NEED TO KNOW ABOUT THE DASH EATING PLAN?  For the DASH eating plan, you will follow these general guidelines:  · Choose foods with a percent daily value for sodium of less than 5% (as listed on the food label).  · Use salt-free seasonings or herbs instead of table salt or sea salt.  · Check with your health care provider or pharmacist before using salt substitutes.  · Eat lower-sodium products, often labeled as "lower sodium" or "no salt added."  · Eat fresh foods.  · Eat more vegetables, fruits, and low-fat dairy products.  · Choose whole grains. Look for the word "whole" as the first word in the ingredient list.  · Choose fish and skinless chicken or turkey more often than red meat. Limit fish, poultry, and meat to 6 oz (170 g) each day.  · Limit sweets, desserts, sugars, and sugary drinks.  · Choose heart-healthy fats.  · Limit cheese to 1 oz (28 g) per day.  · Eat more home-cooked food and less restaurant, buffet, and fast food.  · Limit fried foods.  · Cook foods using methods other than frying.  · Limit canned vegetables. If you do use them, rinse them well to decrease the sodium.  · When eating at a restaurant, ask that your food be prepared with less salt, or no salt if possible.  WHAT FOODS CAN I EAT?  Seek help from a dietitian for individual calorie needs.  Grains  Whole grain or whole wheat bread. Brown rice. Whole grain or whole wheat pasta. Quinoa, bulgur, and whole grain cereals. Low-sodium cereals. Corn or whole wheat flour tortillas. Whole grain cornbread. Whole grain crackers. Low-sodium crackers.  Vegetables  Fresh or frozen vegetables  (raw, steamed, roasted, or grilled). Low-sodium or reduced-sodium tomato and vegetable juices. Low-sodium or reduced-sodium tomato sauce and paste. Low-sodium or reduced-sodium canned vegetables.   Fruits  All fresh, canned (in natural juice), or frozen fruits.  Meat and Other Protein Products  Ground beef (85% or leaner), grass-fed beef, or beef trimmed of fat. Skinless chicken or turkey. Ground chicken or turkey. Pork trimmed of fat. All fish and seafood. Eggs. Dried beans, peas, or lentils. Unsalted nuts and seeds. Unsalted canned beans.  Dairy  Low-fat dairy products, such as skim or 1% milk, 2% or reduced-fat cheeses, low-fat ricotta or cottage cheese, or plain low-fat yogurt. Low-sodium or reduced-sodium cheeses.  Fats and Oils  Tub margarines without trans fats. Light or reduced-fat mayonnaise and salad dressings (reduced sodium). Avocado. Safflower, olive, or canola oils. Natural peanut or almond butter.  Other  Unsalted popcorn and pretzels.  The items listed above may not be a complete list of recommended foods or beverages. Contact your dietitian for more options.  WHAT FOODS ARE NOT RECOMMENDED?  Grains  White bread. White pasta. White rice. Refined cornbread. Bagels and croissants. Crackers that contain trans fat.  Vegetables  Creamed or fried vegetables. Vegetables in a cheese sauce. Regular canned vegetables. Regular canned tomato sauce and paste. Regular tomato and vegetable juices.  Fruits  Dried fruits. Canned fruit in light or heavy syrup. Fruit juice.  Meat and Other Protein   Products  Fatty cuts of meat. Ribs, chicken wings, bacon, sausage, bologna, salami, chitterlings, fatback, hot dogs, bratwurst, and packaged luncheon meats. Salted nuts and seeds. Canned beans with salt.  Dairy  Whole or 2% milk, cream, half-and-half, and cream cheese. Whole-fat or sweetened yogurt. Full-fat cheeses or blue cheese. Nondairy creamers and whipped toppings. Processed cheese, cheese spreads, or cheese  curds.  Condiments  Onion and garlic salt, seasoned salt, table salt, and sea salt. Canned and packaged gravies. Worcestershire sauce. Tartar sauce. Barbecue sauce. Teriyaki sauce. Soy sauce, including reduced sodium. Steak sauce. Fish sauce. Oyster sauce. Cocktail sauce. Horseradish. Ketchup and mustard. Meat flavorings and tenderizers. Bouillon cubes. Hot sauce. Tabasco sauce. Marinades. Taco seasonings. Relishes.  Fats and Oils  Butter, stick margarine, lard, shortening, ghee, and bacon fat. Coconut, palm kernel, or palm oils. Regular salad dressings.  Other  Pickles and olives. Salted popcorn and pretzels.  The items listed above may not be a complete list of foods and beverages to avoid. Contact your dietitian for more information.  WHERE CAN I FIND MORE INFORMATION?  National Heart, Lung, and Blood Institute: www.nhlbi.nih.gov/health/health-topics/topics/dash/     This information is not intended to replace advice given to you by your health care provider. Make sure you discuss any questions you have with your health care provider.     Document Released: 08/12/2011 Document Revised: 09/13/2014 Document Reviewed: 06/27/2013  Elsevier Interactive Patient Education ©2016 Elsevier Inc.

## 2015-08-27 NOTE — Progress Notes (Signed)
   Subjective:    Patient ID: Vanessa Shelton, female    DOB: 05/08/1976, 39 y.o.   MRN: [id number]  Urinary Tract Infection  This is a new problem. Episode onset: 2 days. Associated symptoms comments: Low back pain, strong odor to urine . She has tried nothing for the symptoms.   Headache for 5 days. Has tried everything otc.  Denies fever chills sweats denies chest tightness pressure pain relates family history diabetes  Review of Systems    no blurred vision no vomiting or diarrhea relates the headache as being more temporal and aching sensation. Objective:   Physical Exam  Lungs clear hearts regular blood pressure checked twice Esther reading 134/94 abdomen soft extremities no edema skin warm dry flanks nontender urine with occasional WBC      Assessment & Plan:  Possible UTI treatment with antibiotics culture urine await results  New onset hypertension I believe this is causing her headache lisinopril 5 mg. For the first week take a half tablet a day if blood pressure near the goal of 130/80 stick with a half tablet a day if not go up to a full tablet a day. 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

## 2015-08-29 LAB — URINE CULTURE

## 2015-09-05 ENCOUNTER — Telehealth: Payer: Self-pay | Admitting: Family Medicine

## 2015-09-05 NOTE — Telephone Encounter (Signed)
Pt calling to give her BP readings   She has been taking a whole pill in stead of a half because she was  Having a head ache. She feels better  132/99, 152/63, 111/65, 133/87

## 2015-09-05 NOTE — Telephone Encounter (Signed)
Continue 5 mg qd, if trouble call back follow up ov within 4 weeks- give refills if needed

## 2015-09-05 NOTE — Telephone Encounter (Signed)
[facility]

## 2015-09-09 NOTE — Telephone Encounter (Signed)
Discussed with patient. Patient advised -Continue 5 mg qd, if trouble call back follow up ov within 4 weeks. Patient verbalized understanding and states she has a follow up office visit already scheduled in January.

## 2015-09-16 LAB — HEPATIC FUNCTION PANEL
ALT: 23 IU/L (ref 0–32)
AST: 16 IU/L (ref 0–40)
Albumin: 4.6 g/dL (ref 3.5–5.5)
Alkaline Phosphatase: 89 IU/L (ref 39–117)
BILIRUBIN TOTAL: 0.5 mg/dL (ref 0.0–1.2)
BILIRUBIN, DIRECT: 0.13 mg/dL (ref 0.00–0.40)
Total Protein: 7.4 g/dL (ref 6.0–8.5)

## 2015-09-16 LAB — LIPID PANEL
CHOLESTEROL TOTAL: 179 mg/dL (ref 100–199)
Chol/HDL Ratio: 4.7 ratio units — ABNORMAL HIGH (ref 0.0–4.4)
HDL: 38 mg/dL — ABNORMAL LOW (ref >39)
LDL CALC: 115 mg/dL — AB (ref 0–99)
Triglycerides: 131 mg/dL (ref 0–149)
VLDL CHOLESTEROL CAL: 26 mg/dL (ref 5–40)

## 2015-09-16 LAB — BASIC METABOLIC PANEL
BUN/Creatinine Ratio: 13 (ref 8–20)
BUN: 10 mg/dL (ref 6–20)
CALCIUM: 9.9 mg/dL (ref 8.7–10.2)
CHLORIDE: 103 mmol/L (ref 96–106)
CO2: 20 mmol/L (ref 18–29)
CREATININE: 0.76 mg/dL (ref 0.57–1.00)
GFR calc non Af Amer: 101 mL/min/{1.73_m2} (ref >59)
GFR, EST AFRICAN AMERICAN: 116 mL/min/{1.73_m2} (ref >59)
GLUCOSE: 87 mg/dL (ref 65–99)
Potassium: 4.3 mmol/L (ref 3.5–5.2)
Sodium: 143 mmol/L (ref 134–144)

## 2015-09-16 LAB — TSH: TSH: 2.84 u[IU]/mL (ref 0.450–4.500)

## 2015-09-17 ENCOUNTER — Encounter: Payer: Self-pay | Admitting: Family Medicine

## 2015-09-17 ENCOUNTER — Ambulatory Visit (INDEPENDENT_AMBULATORY_CARE_PROVIDER_SITE_OTHER): Payer: Managed Care, Other (non HMO) | Admitting: Family Medicine

## 2015-09-17 VITALS — BP 114/68 | Ht 63.0 in | Wt 222.0 lb

## 2015-09-17 DIAGNOSIS — E669 Obesity, unspecified: Secondary | ICD-10-CM

## 2015-09-17 DIAGNOSIS — Z720 Tobacco use: Secondary | ICD-10-CM | POA: Diagnosis not present

## 2015-09-17 DIAGNOSIS — I10 Essential (primary) hypertension: Secondary | ICD-10-CM | POA: Insufficient documentation

## 2015-09-17 MED ORDER — LISINOPRIL 5 MG PO TABS: 5.0000 mg | ORAL_TABLET | Freq: Every day | ORAL | Status: DC

## 2015-09-17 NOTE — Patient Instructions (Addendum)
DASH Eating Plan DASH stands for "Dietary Approaches to Stop Hypertension." The DASH eating plan is a healthy eating plan that has been shown to reduce high blood pressure (hypertension). Additional health benefits may include reducing the risk of type 2 diabetes mellitus, heart disease, and stroke. The DASH eating plan may also help with weight loss. WHAT DO I NEED TO KNOW ABOUT THE DASH EATING PLAN? For the DASH eating plan, you will follow these general guidelines:  Choose foods with a percent daily value for sodium of less than 5% (as listed on the food label).  Use salt-free seasonings or herbs instead of table salt or sea salt.  Check with your health care provider or pharmacist before using salt substitutes.  Eat lower-sodium products, often labeled as "lower sodium" or "no salt added."  Eat fresh foods.  Eat more vegetables, fruits, and low-fat dairy products.  Choose whole grains. Look for the word "whole" as the first word in the ingredient list.  Choose fish and skinless chicken or turkey more often than red meat. Limit fish, poultry, and meat to 6 oz (170 g) each day.  Limit sweets, desserts, sugars, and sugary drinks.  Choose heart-healthy fats.  Limit cheese to 1 oz (28 g) per day.  Eat more home-cooked food and less restaurant, buffet, and fast food.  Limit fried foods.  Cook foods using methods other than frying.  Limit canned vegetables. If you do use them, rinse them well to decrease the sodium.  When eating at a restaurant, ask that your food be prepared with less salt, or no salt if possible. WHAT FOODS CAN I EAT? Seek help from a dietitian for individual calorie needs. Grains Whole grain or whole wheat bread. Brown rice. Whole grain or whole wheat pasta. Quinoa, bulgur, and whole grain cereals. Low-sodium cereals. Corn or whole wheat flour tortillas. Whole grain cornbread. Whole grain crackers. Low-sodium crackers. Vegetables Fresh or frozen vegetables  (raw, steamed, roasted, or grilled). Low-sodium or reduced-sodium tomato and vegetable juices. Low-sodium or reduced-sodium tomato sauce and paste. Low-sodium or reduced-sodium canned vegetables.  Fruits All fresh, canned (in natural juice), or frozen fruits. Meat and Other Protein Products Ground beef (85% or leaner), grass-fed beef, or beef trimmed of fat. Skinless chicken or turkey. Ground chicken or turkey. Pork trimmed of fat. All fish and seafood. Eggs. Dried beans, peas, or lentils. Unsalted nuts and seeds. Unsalted canned beans. Dairy Low-fat dairy products, such as skim or 1% milk, 2% or reduced-fat cheeses, low-fat ricotta or cottage cheese, or plain low-fat yogurt. Low-sodium or reduced-sodium cheeses. Fats and Oils Tub margarines without trans fats. Light or reduced-fat mayonnaise and salad dressings (reduced sodium). Avocado. Safflower, olive, or canola oils. Natural peanut or almond butter. Other Unsalted popcorn and pretzels. The items listed above may not be a complete list of recommended foods or beverages. Contact your dietitian for more options. WHAT FOODS ARE NOT RECOMMENDED? Grains White bread. White pasta. White rice. Refined cornbread. Bagels and croissants. Crackers that contain trans fat. Vegetables Creamed or fried vegetables. Vegetables in a cheese sauce. Regular canned vegetables. Regular canned tomato sauce and paste. Regular tomato and vegetable juices. Fruits Dried fruits. Canned fruit in light or heavy syrup. Fruit juice. Meat and Other Protein Products Fatty cuts of meat. Ribs, chicken wings, bacon, sausage, bologna, salami, chitterlings, fatback, hot dogs, bratwurst, and packaged luncheon meats. Salted nuts and seeds. Canned beans with salt. Dairy Whole or 2% milk, cream, half-and-half, and cream cheese. Whole-fat or sweetened yogurt. Full-fat   cheeses or blue cheese. Nondairy creamers and whipped toppings. Processed cheese, cheese spreads, or cheese  curds. Condiments Onion and garlic salt, seasoned salt, table salt, and sea salt. Canned and packaged gravies. Worcestershire sauce. Tartar sauce. Barbecue sauce. Teriyaki sauce. Soy sauce, including reduced sodium. Steak sauce. Fish sauce. Oyster sauce. Cocktail sauce. Horseradish. Ketchup and mustard. Meat flavorings and tenderizers. Bouillon cubes. Hot sauce. Tabasco sauce. Marinades. Taco seasonings. Relishes. Fats and Oils Butter, stick margarine, lard, shortening, ghee, and bacon fat. Coconut, palm kernel, or palm oils. Regular salad dressings. Other Pickles and olives. Salted popcorn and pretzels. The items listed above may not be a complete list of foods and beverages to avoid. Contact your dietitian for more information. WHERE CAN I FIND MORE INFORMATION? National Heart, Lung, and Blood Institute: CableProm[employer].itwww.nhlbi.nih.gov/health/health-topics/topics/dash/   This information is not intended to replace advice given to you by your health care provider. Make sure you discuss any questions you have with your health care provider.   Document Released: 08/12/2011 Document Revised: 09/13/2014 Document Reviewed: 06/27/2013 Elsevier Interactive Patient Education 2016 [employer] Steps to Quit Smoking  Smoking tobacco can be harmful to your health and can affect almost every organ in your body. Smoking puts you, and those around you, at risk for developing many serious chronic diseases. Quitting smoking is difficult, but it is one of the best things that you can do for your health. It is never too late to quit. WHAT ARE THE BENEFITS OF QUITTING SMOKING? When you quit smoking, you lower your risk of developing serious diseases and conditions, such as:  Lung cancer or lung disease, such as COPD.  Heart disease.  Stroke.  Heart attack.  Infertility.  Osteoporosis and bone fractures. Additionally, symptoms such as coughing, wheezing, and shortness of breath may get better when you quit. You may  also find that you get sick less often because your body is stronger at fighting off colds and infections. If you are pregnant, quitting smoking can help to reduce your chances of having a baby of low birth weight. HOW DO I GET READY TO QUIT? When you decide to quit smoking, create a plan to make sure that you are successful. Before you quit:  Pick a date to quit. Set a date within the next two weeks to give you time to prepare.  Write down the reasons why you are quitting. Keep this list in places where you will see it often, such as on your bathroom mirror or in your car or wallet.  Identify the people, places, things, and activities that make you want to smoke (triggers) and avoid them. Make sure to take these actions:  Throw away all cigarettes at home, at work, and in your car.  Throw away smoking accessories, such as Set designerashtrays and lighters.  Clean your car and make sure to empty the ashtray.  Clean your home, including curtains and carpets.  Tell your family, friends, and coworkers that you are quitting. Support from your loved ones can make quitting easier.  Talk with your health care provider about your options for quitting smoking.  Find out what treatment options are covered by your health insurance. WHAT STRATEGIES CAN I USE TO QUIT SMOKING?  Talk with your healthcare provider about different strategies to quit smoking. Some strategies include:  Quitting smoking altogether instead of gradually lessening how much you smoke over a period of time. Research shows that quitting "cold [place]key" is more successful than gradually quitting.  Attending in-person counseling to help  you build problem-solving skills. You are more likely to have success in quitting if you attend several counseling sessions. Even short sessions of 10 minutes can be effective.  Finding resources and support systems that can help you to quit smoking and remain smoke-free after you quit. These resources are most  helpful when you use them often. They can include:  Online chats with a Social worker.  Telephone quitlines.  Printed Furniture conservator/restorer.  Support groups or group counseling.  Text messaging programs.  Mobile phone applications.  Taking medicines to help you quit smoking. (If you are pregnant or breastfeeding, talk with your health care provider first.) Some medicines contain nicotine and some do not. Both types of medicines help with cravings, but the medicines that include nicotine help to relieve withdrawal symptoms. Your health care provider may recommend:  Nicotine patches, gum, or lozenges.  Nicotine inhalers or sprays.  Non-nicotine medicine that is taken by mouth. Talk with your health care provider about combining strategies, such as taking medicines while you are also receiving in-person counseling. Using these two strategies together makes you more likely to succeed in quitting than if you used either strategy on its own. If you are pregnant or breastfeeding, talk with your health care provider about finding counseling or other support strategies to quit smoking. Do not take medicine to help you quit smoking unless told to do so by your health care provider. WHAT THINGS CAN I DO TO MAKE IT EASIER TO QUIT? Quitting smoking might feel overwhelming at first, but there is a lot that you can do to make it easier. Take these important actions:  Reach out to your family and friends and ask that they support and encourage you during this time. Call telephone quitlines, reach out to support groups, or work with a counselor for support.  Ask people who smoke to avoid smoking around you.  Avoid places that trigger you to smoke, such as bars, parties, or smoke-break areas at work.  Spend time around people who do not smoke.  Lessen stress in your life, because stress can be a smoking trigger for some people. To lessen stress, try:  Exercising regularly.  Deep-breathing  exercises.  Yoga.  Meditating.  Performing a body scan. This involves closing your eyes, scanning your body from head to toe, and noticing which parts of your body are particularly tense. Purposefully relax the muscles in those areas.  Download or purchase mobile phone or tablet apps (applications) that can help you stick to your quit plan by providing reminders, tips, and encouragement. There are many free apps, such as QuitGuide from the [employer] Office manager for Disease Control and Prevention). You can find other support for quitting smoking (smoking cessation) through smokefree.gov and other websites. HOW WILL I FEEL WHEN I QUIT SMOKING? Within the first 24 hours of quitting smoking, you may start to feel some withdrawal symptoms. These symptoms are usually most noticeable 2-3 days after quitting, but they usually do not last beyond 2-3 weeks. Changes or symptoms that you might experience include:  Mood swings.  Restlessness, anxiety, or irritation.  Difficulty concentrating.  Dizziness.  Strong cravings for sugary foods in addition to nicotine.  Mild weight gain.  Constipation.  Nausea.  Coughing or a sore throat.  Changes in how your medicines work in your body.  A depressed mood.  Difficulty sleeping (insomnia). After the first 2-3 weeks of quitting, you may start to notice more positive results, such as:  Improved sense of smell and taste.  Decreased coughing and sore throat.  Slower heart rate.  Lower blood pressure.  Clearer skin.  The ability to breathe more easily.  Fewer sick days. Quitting smoking is very challenging for most people. Do not get discouraged if you are not successful the first time. Some people need to make many attempts to quit before they achieve long-term success. Do your best to stick to your quit plan, and talk with your health care provider if you have any questions or concerns.   This information is not intended to replace advice given to  you by your health care provider. Make sure you discuss any questions you have with your health care provider.   Document Released: 08/17/2001 Document Revised: 01/07/2015 Document Reviewed: 01/07/2015 Elsevier Interactive Patient Education [employer]2016 Elsevier Inc.

## 2015-09-17 NOTE — Progress Notes (Signed)
   Subjective:    Patient ID: Vanessa SellersLenora K Shelton, female    DOB: 05/18/1976, 40 y.o.   MRN: [id number]  Hypertension This is a new problem. Pertinent negatives include no chest pain. Treatments tried: lisinopril. Compliance problems: walks alot at work, trying to eat healthy.    She denies being depressed. She does try to watch how she eats. Stays active at work but does have difficult time fitting in exercise outside of that   Review of Systems  Constitutional: Negative for activity change, appetite change and fatigue.  HENT: Negative for congestion.   Respiratory: Negative for cough.   Cardiovascular: Negative for chest pain.  Gastrointestinal: Negative for abdominal pain.  Endocrine: Negative for polydipsia and polyphagia.  Neurological: Negative for weakness.  Psychiatric/Behavioral: Negative for confusion.       Objective:   Physical Exam  Constitutional: She appears well-nourished. No distress.  Cardiovascular: Normal rate, regular rhythm and normal heart sounds.   No murmur heard. Pulmonary/Chest: Effort normal and breath sounds normal. No respiratory distress.  Musculoskeletal: She exhibits no edema.  Lymphadenopathy:    She has no cervical adenopathy.  Neurological: She is alert. She exhibits normal muscle tone.  Psychiatric: Her behavior is normal.  Vitals reviewed.   Patient was counseled to quit smoking. Patient was counseled to try to lose weight    Assessment & Plan:  HTN-doing well on current medication continue current medication exercise more if possible along with watching diet closely follow-up if ongoing troubles I and very confident patient is doing well she can check her blood pressure intermittently at work she will follow-up in approximately 10 months

## 2015-09-23 ENCOUNTER — Other Ambulatory Visit: Payer: Self-pay | Admitting: *Deleted

## 2015-09-23 ENCOUNTER — Telehealth: Payer: Self-pay | Admitting: Family Medicine

## 2015-09-23 NOTE — Telephone Encounter (Signed)
Pt leaving the info for her home deliver of her meds, Dr Lorin Picket waiting on this info  [facility] [phone]

## 2015-09-23 NOTE — Telephone Encounter (Signed)
done

## 2015-09-23 NOTE — Telephone Encounter (Signed)
Nurse's-please put this information into Epic for her pharmacy preference

## 2015-10-23 ENCOUNTER — Ambulatory Visit (INDEPENDENT_AMBULATORY_CARE_PROVIDER_SITE_OTHER): Payer: Managed Care, Other (non HMO) | Admitting: Family Medicine

## 2015-10-23 ENCOUNTER — Encounter: Payer: Self-pay | Admitting: Family Medicine

## 2015-10-23 VITALS — BP 130/80 | Temp 98.2°F | Ht 63.0 in | Wt 223.1 lb

## 2015-10-23 DIAGNOSIS — J019 Acute sinusitis, unspecified: Secondary | ICD-10-CM

## 2015-10-23 DIAGNOSIS — B349 Viral infection, unspecified: Secondary | ICD-10-CM | POA: Diagnosis not present

## 2015-10-23 DIAGNOSIS — B9689 Other specified bacterial agents as the cause of diseases classified elsewhere: Secondary | ICD-10-CM

## 2015-10-23 MED ORDER — AZITHROMYCIN 250 MG PO TABS: ORAL_TABLET | ORAL | Status: DC

## 2015-10-23 NOTE — Progress Notes (Signed)
   Subjective:    Patient ID: Vanessa Shelton, female    DOB: 1976-07-12, 40 y.o.   MRN: [id number]  Cough This is a new problem. Episode onset: 4 days. Associated symptoms include headaches, nasal congestion, rhinorrhea and a sore throat. Associated symptoms comments: Sneezing,congestion. Treatments tried: tylenol cold and flu, nyquil, robitisum cough syrup, throat spray.   patient not feeling well over the past several days low energy denies any high fever chills relates mainly runny nose sinus pressure coughing and headaches  Patient states no oteher concerns this visit.  Review of Systems  HENT: Positive for rhinorrhea and sore throat.   Respiratory: Positive for cough.   Neurological: Positive for headaches.       Objective:   Physical Exam   patient looks to feel ill but she is not toxic. Her lungs are clear hearts regular HEENT benign she does have moderate sinus tenderness      Assessment & Plan:   viral syndrome secondary rhinosinusitis antibiotics prescribed warning signs discussed I don't feel she has the flu although a mild case of flu could be a underlying source

## 2016-01-20 ENCOUNTER — Encounter: Payer: Self-pay | Admitting: Family Medicine

## 2016-01-20 ENCOUNTER — Ambulatory Visit (INDEPENDENT_AMBULATORY_CARE_PROVIDER_SITE_OTHER): Payer: Managed Care, Other (non HMO) | Admitting: Family Medicine

## 2016-01-20 VITALS — BP 100/76 | Temp 98.1°F | Ht 63.0 in | Wt 225.2 lb

## 2016-01-20 DIAGNOSIS — B349 Viral infection, unspecified: Secondary | ICD-10-CM | POA: Diagnosis not present

## 2016-01-20 DIAGNOSIS — J019 Acute sinusitis, unspecified: Secondary | ICD-10-CM | POA: Diagnosis not present

## 2016-01-20 DIAGNOSIS — B9689 Other specified bacterial agents as the cause of diseases classified elsewhere: Secondary | ICD-10-CM

## 2016-01-20 MED ORDER — AMOXICILLIN-POT CLAVULANATE 875-125 MG PO TABS: 1.0000 | ORAL_TABLET | Freq: Two times a day (BID) | ORAL | Status: DC

## 2016-01-20 MED ORDER — HYDROCODONE-HOMATROPINE 5-1.5 MG/5ML PO SYRP: 5.0000 mL | ORAL_SOLUTION | Freq: Four times a day (QID) | ORAL | Status: DC | PRN

## 2016-01-20 MED ORDER — CEFDINIR 300 MG PO CAPS: 300.0000 mg | ORAL_CAPSULE | Freq: Two times a day (BID) | ORAL | Status: DC

## 2016-01-20 NOTE — Progress Notes (Signed)
   Subjective:    Patient ID: Vanessa SellersLenora K Shelton, female    DOB: 07/17/1976, 40 y.o.   MRN: [id number]  Sinusitis This is a new problem. The current episode started in the past 7 days. The problem is unchanged. There has been no fever. The pain is moderate. Associated symptoms include congestion, coughing and a sore throat. (Myalgias) Past treatments include oral decongestants. The treatment provided no relief.   Patient has no other concerns at this time.   Hit sat night felt bad  achey and congested  Frontal h a, dim energy  Sleeping a lot   mucinex nyquil sinus  Gunky yellow disch  No known exp  Some blod in disch   All over aching  Cough pretty intense and hurts to cough thr sore   Review of Systems  HENT: Positive for congestion and sore throat.   Respiratory: Positive for cough.        Objective:   Physical Exam Alert vitals stable. Moderate malaise. HEENT frontal congestion nasal discharge pharynx normal neck supple lungs clear heart rare rhythm       Assessment & Plan:  Impression rhinosinusitis with underlying viral syndrome. Almost sounds like parainfluenza discussed plan antibiotics prescribed. Symptom care discussed Hycodan when necessary for cough WSL

## 2016-05-26 ENCOUNTER — Emergency Department (HOSPITAL_COMMUNITY)
Admission: EM | Admit: 2016-05-26 | Discharge: 2016-05-26 | Disposition: A | Discharge status: Home / Self Care | Payer: Worker's Compensation | Attending: Emergency Medicine | Admitting: Emergency Medicine

## 2016-05-26 ENCOUNTER — Encounter (HOSPITAL_COMMUNITY): Payer: Self-pay | Admitting: Emergency Medicine

## 2016-05-26 DIAGNOSIS — W461XXA Contact with contaminated hypodermic needle, initial encounter: Secondary | ICD-10-CM | POA: Insufficient documentation

## 2016-05-26 DIAGNOSIS — S61031A Puncture wound without foreign body of right thumb without damage to nail, initial encounter: Secondary | ICD-10-CM | POA: Diagnosis not present

## 2016-05-26 DIAGNOSIS — Y939 Activity, unspecified: Secondary | ICD-10-CM | POA: Diagnosis not present

## 2016-05-26 DIAGNOSIS — F17210 Nicotine dependence, cigarettes, uncomplicated: Secondary | ICD-10-CM | POA: Insufficient documentation

## 2016-05-26 DIAGNOSIS — IMO0001 Reserved for inherently not codable concepts without codable children: Secondary | ICD-10-CM

## 2016-05-26 DIAGNOSIS — Y990 Civilian activity done for income or pay: Secondary | ICD-10-CM | POA: Diagnosis not present

## 2016-05-26 DIAGNOSIS — I10 Essential (primary) hypertension: Secondary | ICD-10-CM | POA: Diagnosis not present

## 2016-05-26 DIAGNOSIS — Z7721 Contact with and (suspected) exposure to potentially hazardous body fluids: Secondary | ICD-10-CM

## 2016-05-26 DIAGNOSIS — Z23 Encounter for immunization: Secondary | ICD-10-CM | POA: Insufficient documentation

## 2016-05-26 DIAGNOSIS — Y9289 Other specified places as the place of occurrence of the external cause: Secondary | ICD-10-CM | POA: Diagnosis not present

## 2016-05-26 DIAGNOSIS — J45909 Unspecified asthma, uncomplicated: Secondary | ICD-10-CM | POA: Diagnosis not present

## 2016-05-26 DIAGNOSIS — S6981XA Other specified injuries of right wrist, hand and finger(s), initial encounter: Secondary | ICD-10-CM

## 2016-05-26 DIAGNOSIS — Z79899 Other long term (current) drug therapy: Secondary | ICD-10-CM | POA: Diagnosis not present

## 2016-05-26 LAB — RAPID HIV SCREEN (HIV 1/2 AB+AG)
HIV 1/2 Antibodies: NONREACTIVE
HIV-1 P24 Antigen - HIV24: NONREACTIVE

## 2016-05-26 MED ORDER — TETANUS-DIPHTH-ACELL PERTUSSIS 5-2.5-18.5 LF-MCG/0.5 IM SUSP
0.5000 mL | Freq: Once | INTRAMUSCULAR | Status: AC
  Administered 2016-05-26: 0.5 mL via INTRAMUSCULAR
  Filled 2016-05-26: qty 0.5

## 2016-05-26 NOTE — ED Provider Notes (Signed)
[facility]Y DEPT Provider Note   CSN: [id number] Arrival date & time: 05/26/16  1508     History   Chief Complaint Chief Complaint  Patient presents with  . Body Fluid Exposure    HPI Vanessa Shelton is a 40 y.o. female.  HPI   Vanessa Shelton is a 40 y.o. female who presents to the Emergency Department after a work related blood exposure.  She states that she is an employee at [employer] in [place] and was accidentally stuck with a needle from a patients' fistula.  She reports being stuck at the cuticle of her right thumb, minimal bleeding reported.  the incident occurred at approximately 1:30 today.  She has reported the incident to her supervisor and was recommended to come here for a blood exposure panel.  She has irrigated the site and denies any symptoms at present.  She also states the source patient left the facility before his or her blood could be drawn, but states the patient's blood will be drawn upon return on Friday.     Past Medical History:  Diagnosis Date  . Asthma   . Tobacco abuse     Patient Active Problem List   Diagnosis Date Noted  . HTN (hypertension) 09/17/2015  . Obesity 09/17/2015  . Tobacco abuse 09/17/2015  . Fatigue 12/16/2012  . Well woman exam with routine gynecological exam 12/16/2012    Past Surgical History:  Procedure Laterality Date  . APPENDECTOMY    . CHOLECYSTECTOMY    . TUBAL LIGATION      OB History    No data available       Home Medications    Prior to Admission medications   Medication Sig Start Date End Date Taking? Authorizing Provider  azithromycin (ZITHROMAX Z-PAK) 250 MG tablet Take 2 tablets (500 mg) on  Day 1,  followed by 1 tablet (250 mg) once daily on Days 2 through 5. Patient not taking: Reported on 01/20/2016 10/23/15   Babs Sciara, MD  cefdinir (OMNICEF) 300 MG capsule Take 1 capsule (300 mg total) by mouth 2 (two) times daily. 01/20/16   Merlyn Albert, MD  HYDROcodone-homatropine  [facility]) 5-1.5 MG/5ML syrup Take 5 mLs by mouth every 6 (six) hours as needed for cough. 01/20/16   Merlyn Albert, MD  lisinopril (PRINIVIL,ZESTRIL) 5 MG tablet Take 1 tablet (5 mg total) by mouth daily. 09/17/15   Babs Sciara, MD    Family History Family History  Problem Relation Age of Onset  . Hypertension Maternal Grandmother   . Heart attack Maternal Grandfather     Social History Social History  Substance Use Topics  . Smoking status: Current Every Day Smoker    Packs/day: 0.50    Types: Cigarettes  . Smokeless tobacco: Never Used  . Alcohol use No     Allergies   Bee venom; Penicillins; and Latex   Review of Systems Review of Systems  Constitutional: Negative for chills, fatigue and fever.  HENT: Negative for trouble swallowing.   Respiratory: Negative for shortness of breath and wheezing.   Cardiovascular: Negative for chest pain.  Gastrointestinal: Negative for abdominal pain, nausea and vomiting.  Musculoskeletal: Negative for arthralgias, back pain and myalgias.  Skin: Negative for rash.       Needle stick left thumb  Neurological: Negative for dizziness, weakness and numbness.  Hematological: Does not bruise/bleed easily.     Physical Exam Updated Vital Signs BP 143/57 (BP Location: Left Arm)  Pulse 75   Temp 98.3 F (36.8 C) (Oral)   Resp 18   Ht 5\' 2"  (1.575 m)   Wt 102.1 kg   LMP 05/19/2016   SpO2 98%   BMI 41.15 kg/m   Physical Exam  Constitutional: She is oriented to person, place, and time.  HENT:  Head: Normocephalic.  Neck: Normal range of motion. Neck supple. No Kernig's sign noted.  Cardiovascular: Normal rate and normal heart sounds.   Pulmonary/Chest: Effort normal and breath sounds normal. She has no wheezes.  Abdominal: Normal appearance.  Musculoskeletal: Normal range of motion.  Neurological: She is alert and oriented to person, place, and time.  Skin: Skin is warm and dry. No rash noted.  Small puncture wound to  distal right thumb.  No edema, no active bleeding     ED Treatments / Results  Labs (all labs ordered are listed, but only abnormal results are displayed) Labs Reviewed  RAPID HIV SCREEN (HIV 1/2 AB+AG)  HEPATITIS PANEL, ACUTE    EKG  EKG Interpretation None       Radiology No results found.  Procedures Procedures (including critical care time)  Medications Ordered in ED Medications  Tdap (BOOSTRIX) injection 0.5 mL (not administered)     Initial Impression / Assessment and Plan / ED Course  I have reviewed the triage vital signs and the nursing notes.  Pertinent labs & imaging results that were available during my care of the patient were reviewed by me and considered in my medical decision making (see chart for details).  Clinical Course    Pt's supervisor was contacted.  Acute Hep panel and rapid HIV test drawn.  TD updated.  Pt denies symptoms at present.  Source pt to have blood drawn at [facility].    Final Clinical Impressions(s) / ED Diagnoses   Final diagnoses:  Exposure to blood or body fluid  Needle stick injury of finger, right, initial encounter    New Prescriptions New Prescriptions   No medications on file     Pauline Ausammy Lissandro Dilorenzo, Cordelia Poche-C 05/26/16 1657    Bethann BerkshireJoseph Zammit, MD 05/28/16 1459

## 2016-05-26 NOTE — ED Triage Notes (Signed)
Pt works at [employer], pt was stuck with fistula needle at 1310 today.   Pt has paper work with her about exposure

## 2016-05-26 NOTE — Discharge Instructions (Signed)
Return if needed.  Your blood has been drawn for a Hepatitis panel and a rapid HIV test.  You will be contacted if any results are positive.

## 2016-05-27 LAB — HEPATITIS PANEL, ACUTE
HCV Ab: <0.1 s/co ratio (ref 0.0–0.9)
HEP B S AG: NEGATIVE
Hep A IgM: NEGATIVE
Hep B C IgM: NEGATIVE

## 2016-09-30 ENCOUNTER — Telehealth: Payer: Self-pay | Admitting: Family Medicine

## 2016-09-30 MED ORDER — LISINOPRIL 5 MG PO TABS: 5.0000 mg | ORAL_TABLET | Freq: Every day | ORAL | 0 refills | Status: DC

## 2016-09-30 NOTE — Telephone Encounter (Signed)
Patient needing refill on lisinopril 5 mg called into [facility] [place]

## 2016-09-30 NOTE — Telephone Encounter (Signed)
Notified patient med sent to pharmacy.  

## 2016-10-26 ENCOUNTER — Encounter: Payer: Self-pay | Admitting: Family Medicine

## 2016-10-26 ENCOUNTER — Ambulatory Visit (INDEPENDENT_AMBULATORY_CARE_PROVIDER_SITE_OTHER): Payer: Managed Care, Other (non HMO) | Admitting: Family Medicine

## 2016-10-26 VITALS — Temp 98.4°F | Ht 63.0 in | Wt 239.4 lb

## 2016-10-26 DIAGNOSIS — M545 Low back pain: Secondary | ICD-10-CM

## 2016-10-26 DIAGNOSIS — R358 Other polyuria: Secondary | ICD-10-CM | POA: Diagnosis not present

## 2016-10-26 DIAGNOSIS — I10 Essential (primary) hypertension: Secondary | ICD-10-CM | POA: Diagnosis not present

## 2016-10-26 DIAGNOSIS — R3589 Other polyuria: Secondary | ICD-10-CM

## 2016-10-26 LAB — POCT URINALYSIS DIPSTICK
SPEC GRAV UA: 1.025
pH, UA: 6

## 2016-10-26 MED ORDER — LISINOPRIL 5 MG PO TABS: 5.0000 mg | ORAL_TABLET | Freq: Every day | ORAL | 2 refills | Status: DC

## 2016-10-26 MED ORDER — CHLORZOXAZONE 500 MG PO TABS: 500.0000 mg | ORAL_TABLET | Freq: Three times a day (TID) | ORAL | 0 refills | Status: DC | PRN

## 2016-10-26 MED ORDER — NAPROXEN 500 MG PO TABS: 500.0000 mg | ORAL_TABLET | Freq: Two times a day (BID) | ORAL | 1 refills | Status: DC

## 2016-10-26 NOTE — Progress Notes (Signed)
   Subjective:    Patient ID: Vanessa Shelton, female    DOB: 09/08/1975, 41 y.o.   MRN: [id number]  HPI Patient arrives with c/o lower back pain for a few days Patient with significant back strain over the course of the past several days. Relates a lot of pain discomfort upper back mid back low back does not radiate down the legs. Does not know any specific injury but does a lot of lifting with her job she denies any dysuria but states slight urinary frequency denies burning or discomfort no fever or chills.  Review of Systems Please see above no cough wheezing vomiting diarrhea    Objective:   Physical Exam   HEENT benign neck no masses lungs are clear no crackles heart regular back subjective tenderness in the mid back upper back negative straight leg raise abdomen soft urinalysis negative     Assessment & Plan:  Urine culture pending No antibiotics indicated Muscle stretches shown No lifting over the next several days Anti-inflammatories for the next 5-7 days Muscle relaxers only one at home If ongoing troubles follow-up  Patient's blood pressure under good control refills given follow-up this spring

## 2016-10-28 LAB — URINE CULTURE

## 2016-11-03 ENCOUNTER — Other Ambulatory Visit: Payer: Self-pay | Admitting: Family Medicine

## 2016-11-19 ENCOUNTER — Other Ambulatory Visit: Payer: Self-pay | Admitting: *Deleted

## 2016-11-19 MED ORDER — LISINOPRIL 5 MG PO TABS
5.0000 mg | ORAL_TABLET | Freq: Every day | ORAL | 0 refills | Status: DC
Start: 2016-11-19 — End: 2017-12-20

## 2016-12-05 ENCOUNTER — Emergency Department (HOSPITAL_COMMUNITY)
Admission: EM | Admit: 2016-12-05 | Discharge: 2016-12-05 | Disposition: A | Discharge status: Home / Self Care | Payer: Managed Care, Other (non HMO) | Attending: Emergency Medicine | Admitting: Emergency Medicine

## 2016-12-05 ENCOUNTER — Encounter (HOSPITAL_COMMUNITY): Payer: Self-pay

## 2016-12-05 DIAGNOSIS — F17210 Nicotine dependence, cigarettes, uncomplicated: Secondary | ICD-10-CM | POA: Insufficient documentation

## 2016-12-05 DIAGNOSIS — H109 Unspecified conjunctivitis: Secondary | ICD-10-CM

## 2016-12-05 DIAGNOSIS — J029 Acute pharyngitis, unspecified: Secondary | ICD-10-CM | POA: Diagnosis not present

## 2016-12-05 DIAGNOSIS — J45909 Unspecified asthma, uncomplicated: Secondary | ICD-10-CM | POA: Insufficient documentation

## 2016-12-05 DIAGNOSIS — I10 Essential (primary) hypertension: Secondary | ICD-10-CM | POA: Diagnosis not present

## 2016-12-05 DIAGNOSIS — Z79899 Other long term (current) drug therapy: Secondary | ICD-10-CM | POA: Insufficient documentation

## 2016-12-05 DIAGNOSIS — H578 Other specified disorders of eye and adnexa: Secondary | ICD-10-CM | POA: Diagnosis present

## 2016-12-05 MED ORDER — FLUORESCEIN SODIUM 0.6 MG OP STRP
ORAL_STRIP | OPHTHALMIC | Status: AC
  Filled 2016-12-05: qty 1

## 2016-12-05 MED ORDER — HYDROCODONE-ACETAMINOPHEN 5-325 MG PO TABS: 1.0000 | ORAL_TABLET | ORAL | 0 refills | Status: DC | PRN

## 2016-12-05 MED ORDER — FLUORESCEIN-BENOXINATE 0.25-0.4 % OP SOLN: 1.0000 [drp] | Freq: Once | OPHTHALMIC | Status: DC

## 2016-12-05 MED ORDER — FLUORESCEIN SODIUM 0.6 MG OP STRP
1.0000 | ORAL_STRIP | Freq: Once | OPHTHALMIC | Status: AC
  Administered 2016-12-05: 1 via OPHTHALMIC

## 2016-12-05 MED ORDER — TOBRAMYCIN 0.3 % OP SOLN
2.0000 [drp] | Freq: Once | OPHTHALMIC | Status: AC
  Administered 2016-12-05: 2 [drp] via OPHTHALMIC
  Filled 2016-12-05: qty 5

## 2016-12-05 MED ORDER — ACETAMINOPHEN 325 MG PO TABS
650.0000 mg | ORAL_TABLET | Freq: Once | ORAL | Status: AC
  Administered 2016-12-05: 650 mg via ORAL
  Filled 2016-12-05: qty 2

## 2016-12-05 MED ORDER — TETRACAINE HCL 0.5 % OP SOLN
OPHTHALMIC | Status: AC
  Filled 2016-12-05: qty 4

## 2016-12-05 MED ORDER — IBUPROFEN 800 MG PO TABS
800.0000 mg | ORAL_TABLET | Freq: Once | ORAL | Status: AC
  Administered 2016-12-05: 800 mg via ORAL
  Filled 2016-12-05: qty 1

## 2016-12-05 MED ORDER — TETRACAINE HCL 0.5 % OP SOLN
1.0000 [drp] | Freq: Once | OPHTHALMIC | Status: AC
  Administered 2016-12-05: 1 [drp] via OPHTHALMIC

## 2016-12-05 NOTE — Discharge Instructions (Signed)
Your examination is consistent with conjunctivitis or pinkeye. This is highly contagious. Please keep your distance from others. Please wash her hands frequently. Please wash down surfaces frequently. Use dark glasses, and a hat with brim until this has resolved. Try to avoid changing lighting conditions is much as possible. Apply cool compresses to your eye 3 or 4 times daily. Use 2 drops of tobramycin every 4 hours for the next 5 days. Please see your primary physician for additional evaluation if not improving.

## 2016-12-05 NOTE — ED Triage Notes (Signed)
Left eye pain and redness since Thursday. Denies vision issues.

## 2016-12-05 NOTE — ED Provider Notes (Signed)
[facility] Provider Note   CSN: [id number] Arrival date & time: 12/05/16  1144  By signing my name below, I, Nelwyn Salisbury, attest that this documentation has been prepared under the direction and in the presence of non-physician practitioner, Ivery Quale, PA-C. Electronically Signed: Nelwyn Salisbury, Scribe. 12/05/2016. 11:50 AM.  History   Chief Complaint Chief Complaint  Patient presents with  . Eye Problem   The history is provided by the patient. No language interpreter was used.    HPI Comments:  Vanessa Shelton is a 41 y.o. female who presents to the Emergency Department complaining of sudden-onset, constant eye pain onset 3 days. She describes her pain as a burning sensation. Pt reports associated mild sore throat, scleral redness and swelling to the eye orbit. She has tried OTC eye drops with no relief. Denies any congestion or rhinorrhea. No mechanism of injury indicated.   Past Medical History:  Diagnosis Date  . Asthma   . Tobacco abuse     Patient Active Problem List   Diagnosis Date Noted  . HTN (hypertension) 09/17/2015  . Obesity 09/17/2015  . Tobacco abuse 09/17/2015  . Fatigue 12/16/2012  . Well woman exam with routine gynecological exam 12/16/2012    Past Surgical History:  Procedure Laterality Date  . APPENDECTOMY    . CHOLECYSTECTOMY    . TUBAL LIGATION      OB History    No data available       Home Medications    Prior to Admission medications   Medication Sig Start Date End Date Taking? Authorizing Provider  azithromycin (ZITHROMAX Z-PAK) 250 MG tablet Take 2 tablets (500 mg) on  Day 1,  followed by 1 tablet (250 mg) once daily on Days 2 through 5. Patient not taking: Reported on 01/20/2016 10/23/15   Babs Sciara, MD  cefdinir (OMNICEF) 300 MG capsule Take 1 capsule (300 mg total) by mouth 2 (two) times daily. Patient not taking: Reported on 10/26/2016 01/20/16   Merlyn Albert, MD  chlorzoxazone (PARAFON FORTE DSC) 500 MG tablet  Take 1 tablet (500 mg total) by mouth 3 (three) times daily as needed for muscle spasms. 10/26/16   Babs Sciara, MD  HYDROcodone-homatropine (HYCODAN) 5-1.5 MG/5ML syrup Take 5 mLs by mouth every 6 (six) hours as needed for cough. Patient not taking: Reported on 10/26/2016 01/20/16   Merlyn Albert, MD  lisinopril (PRINIVIL,ZESTRIL) 5 MG tablet Take 1 tablet (5 mg total) by mouth daily. 11/19/16   Babs Sciara, MD  naproxen (NAPROSYN) 500 MG tablet Take 1 tablet (500 mg total) by mouth 2 (two) times daily with a meal. 10/26/16   Babs Sciara, MD    Family History Family History  Problem Relation Age of Onset  . Hypertension Maternal Grandmother   . Heart attack Maternal Grandfather     Social History Social History  Substance Use Topics  . Smoking status: Current Every Day Smoker    Packs/day: 0.50    Types: Cigarettes  . Smokeless tobacco: Never Used  . Alcohol use No     Allergies   Bee venom; Penicillins; and Latex   Review of Systems Review of Systems  HENT: Positive for congestion and sore throat. Negative for rhinorrhea.   Eyes: Positive for pain and redness.     Physical Exam Updated Vital Signs BP 133/69 (BP Location: Right Arm)   Pulse 72   Temp 97.9 F (36.6 C) (Oral)   Resp 15   Ht   (1.575 m)   Wt 240 lb (108.9 kg)   LMP 11/06/2016   SpO2 100%   BMI 43.90 kg/m   Physical Exam  Constitutional: She is oriented to person, place, and time. She appears well-developed and well-nourished. No distress.  HENT:  Head: Normocephalic and atraumatic.  Eyes: Conjunctivae are normal. Pupils are equal, round, and reactive to light.  Soreness to upper lid and upper orbit area. Increased redness to the conjunctiva and bulb of conjunctiva. Left anterior chamber clear. Disc is sharp and flat. No hemorrhage. No exudate. Fluorescein examination shows no corneal abrasion. No corneal ulcer noted. The examination is consistent with conjunctivitis.   Cardiovascular:  Normal rate.   Pulmonary/Chest: Effort normal.  Abdominal: She exhibits no distension.  Lymphadenopathy:    She has no cervical adenopathy.  Neurological: She is alert and oriented to person, place, and time.  Skin: Skin is warm and dry.  Psychiatric: She has a normal mood and affect.  Nursing note and vitals reviewed.    ED Treatments / Results  DIAGNOSTIC STUDIES:  Oxygen Saturation is 100% on RA, normal by my interpretation.    COORDINATION OF CARE:  12:07 PM Discussed treatment plan with pt at bedside which includes eye examination and pt agreed to plan.  Labs (all labs ordered are listed, but only abnormal results are displayed) Labs Reviewed - No data to display  EKG  EKG Interpretation None       Radiology No results found.  Procedures Procedures (including critical care time)  Medications Ordered in ED Medications - No data to display   Initial Impression / Assessment and Plan / ED Course  I have reviewed the triage vital signs and the nursing notes.  Pertinent labs & imaging results that were available during my care of the patient were reviewed by me and considered in my medical decision making (see chart for details).      Final Clinical Impressions(s) / ED Diagnoses  MDM:  Increased redness and pain of left eye for three days. No history of injury or trauma. No recent procedures. Patient has sinus congestion and scratchy throat. Suspect conjunctivitis but will check for corneal abrasion and/or foreign body.   No abrasion noted on fluorescein examination. Pt will be treated with cool compresses, Tobramycin ophthalmic drops, tylenol and ibuprofen for headache.    Final diagnoses:  Conjunctivitis of left eye, unspecified conjunctivitis type    New Prescriptions New Prescriptions   No medications on file  **I personally performed the services described in this documentation, which was scribed in my presence. The recorded information has been  reviewed and is accurate.    Ivery Quale, PA-C 12/05/16 1244    Donnetta Hutching, MD 12/06/16 1345

## 2016-12-09 ENCOUNTER — Encounter: Payer: Self-pay | Admitting: Family Medicine

## 2016-12-09 ENCOUNTER — Ambulatory Visit (INDEPENDENT_AMBULATORY_CARE_PROVIDER_SITE_OTHER): Payer: Managed Care, Other (non HMO) | Admitting: Nurse Practitioner

## 2016-12-09 ENCOUNTER — Encounter: Payer: Self-pay | Admitting: Nurse Practitioner

## 2016-12-09 VITALS — BP 138/90 | Temp 98.2°F | Ht 62.0 in | Wt 239.0 lb

## 2016-12-09 DIAGNOSIS — L03213 Periorbital cellulitis: Secondary | ICD-10-CM

## 2016-12-09 MED ORDER — CLINDAMYCIN HCL 300 MG PO CAPS: 300.0000 mg | ORAL_CAPSULE | Freq: Three times a day (TID) | ORAL | 0 refills | Status: DC

## 2016-12-09 NOTE — Progress Notes (Addendum)
Subjective:  Presents for complaints of left eye pain and swelling that began a week ago. Was seen at local ED on 4/1 diagnosed with conjunctivitis. Has been on tobramycin eyedrops since then with minimal relief. Fluorescent stain was applied during that visit, no corneal abrasions were noted. No fever. No history of injury. Has developed mild left temporal area headache. No visual changes. Some pain especially with looking down or to the left. No rash. No drainage. Some increased tearing. No involvement of the right eye. No known contacts. Tenderness is mainly above the eye near the brow.  Objective:   BP 138/90   Temp 98.2 F (36.8 C) (Oral)   Ht  (1.575 m)   Wt 239 lb (108.4 kg)   BMI 43.71 kg/m  NAD. Alert, oriented. Right conjunctiva clear. Left conjunctiva mild generalized injection. Pupils equal and reactive to light. Funduscopic scopic exam grossly normal. Mild edema of the upper eyelid. Minimal erythema. Tenderness along the upper brow and upper eyelid. No tenderness below the eye. Minimal tenderness in the left temporal area. Normal EOMs. Frequent clear tearing.  Assessment:  Periorbital cellulitis of left eye    Plan:   Meds ordered this encounter  Medications  . clindamycin (CLEOCIN) 300 MG capsule    Sig: Take 1 capsule (300 mg total) by mouth 3 (three) times daily.    Dispense:  30 capsule    Refill:  0    Order Specific Question:   Supervising Provider    Answer:   Merlyn Albert [[id number]]   Warm compresses.  Restart naproxen with food as directed. Start antibiotic. Recheck tomorrow, call back sooner if any problems. Warning signs were reviewed.

## 2016-12-10 ENCOUNTER — Ambulatory Visit (INDEPENDENT_AMBULATORY_CARE_PROVIDER_SITE_OTHER): Payer: Managed Care, Other (non HMO) | Admitting: Family Medicine

## 2016-12-10 ENCOUNTER — Encounter: Payer: Self-pay | Admitting: Family Medicine

## 2016-12-10 VITALS — BP 148/96 | Temp 98.3°F | Ht 62.0 in | Wt 239.0 lb

## 2016-12-10 DIAGNOSIS — H5789 Other specified disorders of eye and adnexa: Secondary | ICD-10-CM

## 2016-12-10 DIAGNOSIS — H578 Other specified disorders of eye and adnexa: Secondary | ICD-10-CM

## 2016-12-10 DIAGNOSIS — H5712 Ocular pain, left eye: Secondary | ICD-10-CM

## 2016-12-10 MED ORDER — HYDROCODONE-ACETAMINOPHEN 5-325 MG PO TABS: 1.0000 | ORAL_TABLET | ORAL | 0 refills | Status: DC | PRN

## 2016-12-10 NOTE — Progress Notes (Signed)
   Subjective:    Patient ID: Vanessa Shelton, female    DOB: 1976/05/02, 41 y.o.   MRN: [id number]  Eye Pain   The left eye is affected. This is a new problem. Episode onset: 8 days. Treatments tried: antibiotics, naproxen, prescrription eye drops, clear eyes, went to ED on sunday and office visit with carolyn yesterday.      Review of Systems  Eyes: Positive for pain.       Objective:   Physical Exam  There is some swelling in the upper eyelid some redness in the eye tenderness to the eyeball no obvious cellulitis.      Assessment & Plan:  Left eye redness pain there is no sign of corneal abrasion with the dye test that I did. I believe the patient would be best served by seeing eye doctor we called her doctor who recommended eye specialist in [place] we set up that appointment-patient is aware of the importance of going to get this checked out today and agrees to get

## 2016-12-17 ENCOUNTER — Encounter: Payer: Self-pay | Admitting: Family Medicine

## 2016-12-17 ENCOUNTER — Ambulatory Visit (INDEPENDENT_AMBULATORY_CARE_PROVIDER_SITE_OTHER): Payer: Managed Care, Other (non HMO) | Admitting: Family Medicine

## 2016-12-17 VITALS — BP 120/88 | Ht 62.0 in | Wt 238.0 lb

## 2016-12-17 DIAGNOSIS — J019 Acute sinusitis, unspecified: Secondary | ICD-10-CM

## 2016-12-17 DIAGNOSIS — B9689 Other specified bacterial agents as the cause of diseases classified elsewhere: Secondary | ICD-10-CM

## 2016-12-17 NOTE — Progress Notes (Signed)
   Subjective:    Patient ID: Vanessa Shelton, female    DOB: [birth year], 41 y.o.   MRN: [id number]  Eye Pain   The left eye is affected. This is a recurrent problem.   Patient in today for a follow up for eye pain. Patient seen at [facility] on Sunday and diagnosed with sinusitis.  States no other concerns this visit.    Review of Systems  Eyes: Positive for pain.   The eye specialist told her she had pinkeye she did not get better she went to the ER they did a CAT scan which showed a sinus infection they put her on doxycycline she has been improving    Objective:   Physical Exam  Her eyes appear normal she does have some tenderness in the ethmoid sinus on the left side eardrums normal throat normal neck no masses lungs clear  She may return to work without restrictions    Assessment & Plan:  Persistent sinusitis finished doxycycline she already has clindamycin at home she may take that for one week she also should by probiotic and used to a day for the next couple weeks she is to follow-up if progressive troubles or worse

## 2016-12-29 ENCOUNTER — Telehealth: Payer: Self-pay | Admitting: Family Medicine

## 2016-12-29 NOTE — Telephone Encounter (Signed)
Patient had FMLA faxed over on 4/17 but were paid for 4/23.please review form,fill in highlighted areas,date and sign. Form in folder.

## 2016-12-30 ENCOUNTER — Telehealth: Payer: Self-pay | Admitting: Family Medicine

## 2016-12-30 NOTE — Telephone Encounter (Signed)
Calling to check status of form & wants to know when is doc going to [place] so we can send back  Please call to update on status   Francis Gaines - [phone]

## 2017-09-16 ENCOUNTER — Telehealth: Payer: Self-pay | Admitting: Family Medicine

## 2017-09-16 MED ORDER — CHLORZOXAZONE 500 MG PO TABS: 500.0000 mg | ORAL_TABLET | Freq: Three times a day (TID) | ORAL | 1 refills | Status: DC | PRN

## 2017-09-16 NOTE — Telephone Encounter (Signed)
Patient have muscle spasms in her back again and wanted to see if she could get a refill on:  chlorzoxazone (PARAFON FORTE DSC) 500 MG tablet  [facility] [place]

## 2017-09-16 NOTE — Telephone Encounter (Signed)
Refill times 2 

## 2017-09-16 NOTE — Telephone Encounter (Signed)
Prescriptions sent electronically to pharmacy. Patient notified. °

## 2017-10-19 ENCOUNTER — Encounter: Payer: Self-pay | Admitting: Family Medicine

## 2017-12-20 ENCOUNTER — Encounter: Payer: Self-pay | Admitting: Family Medicine

## 2017-12-20 ENCOUNTER — Ambulatory Visit (INDEPENDENT_AMBULATORY_CARE_PROVIDER_SITE_OTHER): Payer: Managed Care, Other (non HMO) | Admitting: Family Medicine

## 2017-12-20 VITALS — BP 148/80 | Temp 98.1°F | Ht 62.0 in | Wt 239.1 lb

## 2017-12-20 DIAGNOSIS — J019 Acute sinusitis, unspecified: Secondary | ICD-10-CM | POA: Diagnosis not present

## 2017-12-20 DIAGNOSIS — B9689 Other specified bacterial agents as the cause of diseases classified elsewhere: Secondary | ICD-10-CM

## 2017-12-20 MED ORDER — DOXYCYCLINE HYCLATE 100 MG PO TABS: 100.0000 mg | ORAL_TABLET | Freq: Two times a day (BID) | ORAL | 0 refills | Status: AC

## 2017-12-20 MED ORDER — LISINOPRIL 5 MG PO TABS: 5.0000 mg | ORAL_TABLET | Freq: Every day | ORAL | 1 refills | Status: DC

## 2017-12-20 NOTE — Progress Notes (Signed)
   Subjective:    Patient ID: Vanessa SellersLenora K Seney, female    DOB: 08/07/1976, 42 y.o.   MRN: [id number]  HPI  Patient is here today stating she has had a sore throat and cough,bilateral ear pain,headache for over a week. Has been taking Otc medications for allergies and sinus,cough drops. Review of Systems  Started with stuffiness nose and sore throat and cough    Still coughing gets phlegm up  dranage bad    Pressure frontal and in the sinus area   Night time cough not too bad      Review of Systems No headache, no major weight loss or weight gain, no chest pain no back pain abdominal pain no change in bowel habits complete ROS otherwise negative     Objective:   Physical Exam  Alert, mild malaise. Hydration good Vitals stable. frontal/ maxillary tenderness evident positive nasal congestion. pharynx normal neck supple  lungs clear/no crackles or wheezes. heart regular in rhythm       Assessment & Plan:  Impression rhinosinusitis likely post viral, discussed with patient. plan antibiotics prescribed. Questions answered. Symptomatic care discussed. warning signs discussed. WSL

## 2018-10-05 ENCOUNTER — Emergency Department (HOSPITAL_COMMUNITY): Payer: Managed Care, Other (non HMO)

## 2018-10-05 ENCOUNTER — Encounter (HOSPITAL_COMMUNITY): Payer: Self-pay

## 2018-10-05 ENCOUNTER — Other Ambulatory Visit: Payer: Self-pay

## 2018-10-05 ENCOUNTER — Emergency Department (HOSPITAL_COMMUNITY)
Admission: EM | Admit: 2018-10-05 | Discharge: 2018-10-05 | Disposition: A | Discharge status: Home / Self Care | Payer: Managed Care, Other (non HMO) | Attending: Emergency Medicine | Admitting: Emergency Medicine

## 2018-10-05 DIAGNOSIS — F17210 Nicotine dependence, cigarettes, uncomplicated: Secondary | ICD-10-CM | POA: Insufficient documentation

## 2018-10-05 DIAGNOSIS — J40 Bronchitis, not specified as acute or chronic: Secondary | ICD-10-CM | POA: Insufficient documentation

## 2018-10-05 DIAGNOSIS — Z9049 Acquired absence of other specified parts of digestive tract: Secondary | ICD-10-CM | POA: Insufficient documentation

## 2018-10-05 DIAGNOSIS — R51 Headache: Secondary | ICD-10-CM | POA: Diagnosis present

## 2018-10-05 DIAGNOSIS — I10 Essential (primary) hypertension: Secondary | ICD-10-CM | POA: Diagnosis not present

## 2018-10-05 DIAGNOSIS — Z79899 Other long term (current) drug therapy: Secondary | ICD-10-CM | POA: Insufficient documentation

## 2018-10-05 DIAGNOSIS — Z91040 Latex allergy status: Secondary | ICD-10-CM | POA: Diagnosis not present

## 2018-10-05 MED ORDER — ALBUTEROL SULFATE HFA 108 (90 BASE) MCG/ACT IN AERS
2.0000 | INHALATION_SPRAY | Freq: Once | RESPIRATORY_TRACT | Status: AC
  Administered 2018-10-05: 2 via RESPIRATORY_TRACT
  Filled 2018-10-05: qty 6.7

## 2018-10-05 MED ORDER — PREDNISONE 20 MG PO TABS: 40.0000 mg | ORAL_TABLET | Freq: Every day | ORAL | 0 refills | Status: DC

## 2018-10-05 MED ORDER — PROMETHAZINE-DM 6.25-15 MG/5ML PO SYRP: 5.0000 mL | ORAL_SOLUTION | Freq: Four times a day (QID) | ORAL | 0 refills | Status: DC | PRN

## 2018-10-05 NOTE — ED Triage Notes (Signed)
Pt reports HA started on Saturday and coughing started on Monday. Cough getting worse each day

## 2018-10-05 NOTE — Discharge Instructions (Addendum)
Drink plenty of fluids.  Take the prednisone as directed until its finished.  2 puffs of the inhaler 4 times a day as needed.  Follow-up with your doctor early next week or return here for any worsening symptoms

## 2018-10-06 NOTE — ED Provider Notes (Signed)
[facility] EMERGENCY DEPARTMENT Provider Note   CSN: [id number] Arrival date & time: 10/05/18  1036     History   Chief Complaint Chief Complaint  Patient presents with  . Cough    HPI Vanessa Shelton is a 43 y.o. female.  HPI   Vanessa Shelton is a 43 y.o. female who presents to the Emergency Department complaining of frontal headache waxing and waning for several days and cough and nasal congestion that both developed the following day.  She states the cough is worsening in severity and frequency.  Cough is mostly non-productive.  She describes her headache as throbbing across her forehead and into both temples.  Headache worsens with coughing.  She denies shortness of breath, fever, chest pain, hemoptysis, and body aches.    Past Medical History:  Diagnosis Date  . Asthma   . Tobacco abuse     Patient Active Problem List   Diagnosis Date Noted  . HTN (hypertension) 09/17/2015  . Obesity 09/17/2015  . Tobacco abuse 09/17/2015  . Fatigue 12/16/2012  . Well woman exam with routine gynecological exam 12/16/2012    Past Surgical History:  Procedure Laterality Date  . APPENDECTOMY    . CHOLECYSTECTOMY    . TUBAL LIGATION       OB History   No obstetric history on file.      Home Medications    Prior to Admission medications   Medication Sig Start Date End Date Taking? Authorizing Provider  chlorzoxazone (PARAFON FORTE DSC) 500 MG tablet Take 1 tablet (500 mg total) by mouth 3 (three) times daily as needed for muscle spasms. 09/16/17   Babs Sciara, MD  HYDROcodone-acetaminophen (NORCO/VICODIN) 5-325 MG tablet Take 1 tablet by mouth every 4 (four) hours as needed. Patient not taking: Reported on 12/20/2017 12/10/16   Babs Sciara, MD  lisinopril (PRINIVIL,ZESTRIL) 5 MG tablet Take 1 tablet (5 mg total) by mouth daily. 12/20/17   Merlyn Albert, MD  predniSONE (DELTASONE) 20 MG tablet Take 2 tablets (40 mg total) by mouth daily. 10/05/18   Tija Biss, PA-C   promethazine-dextromethorphan (PROMETHAZINE-DM) 6.25-15 MG/5ML syrup Take 5 mLs by mouth 4 (four) times daily as needed. 10/05/18   Pauline Aus, PA-C    Family History Family History  Problem Relation Age of Onset  . Hypertension Maternal Grandmother   . Heart attack Maternal Grandfather     Social History Social History   Tobacco Use  . Smoking status: Current Every Day Smoker    Packs/day: 1.00    Types: Cigarettes  . Smokeless tobacco: Never Used  Substance Use Topics  . Alcohol use: No  . Drug use: No     Allergies   Bee venom; Penicillins; and Latex   Review of Systems Review of Systems  Constitutional: Negative for appetite change, chills and fever.  HENT: Positive for congestion. Negative for sore throat and trouble swallowing.   Respiratory: Positive for cough. Negative for chest tightness, shortness of breath and wheezing.   Cardiovascular: Negative for chest pain.  Gastrointestinal: Negative for abdominal pain, nausea and vomiting.  Genitourinary: Negative for dysuria.  Musculoskeletal: Negative for arthralgias, myalgias, neck pain and neck stiffness.  Skin: Negative for rash.  Neurological: Positive for headaches. Negative for dizziness, weakness and numbness.  Hematological: Negative for adenopathy.     Physical Exam Updated Vital Signs BP (!) 148/88 (BP Location: Right Arm)   Pulse 86   Temp 98 F (36.7 C) (Oral)   Resp  12   Ht 5\' 3"  (1.6 m)   Wt 101.2 kg   LMP 09/22/2018 Comment: tubal ligation  SpO2 96%   BMI 39.50 kg/m   Physical Exam Vitals signs and nursing note reviewed.  Constitutional:      General: She is not in acute distress.    Appearance: She is well-developed. She is not ill-appearing or toxic-appearing.  HENT:     Head: Normocephalic.     Right Ear: Tympanic membrane and ear canal normal.     Left Ear: Tympanic membrane and ear canal normal.     Mouth/Throat:     Mouth: Mucous membranes are moist.     Pharynx: Uvula  midline. No oropharyngeal exudate or posterior oropharyngeal erythema.  Neck:     Musculoskeletal: Full passive range of motion without pain, normal range of motion and neck supple.     Trachea: Phonation normal.  Cardiovascular:     Rate and Rhythm: Normal rate and regular rhythm.     Heart sounds: Normal heart sounds. No murmur.  Pulmonary:     Effort: Pulmonary effort is normal. No respiratory distress.     Breath sounds: No stridor. No rales.     Comments: Course lung sounds bilaterally that clear with cough.  No rales or wheezing Chest:     Chest wall: No tenderness.  Lymphadenopathy:     Cervical: No cervical adenopathy.  Skin:    General: Skin is warm.     Findings: No rash.  Neurological:     General: No focal deficit present.     Mental Status: She is alert.     Motor: No abnormal muscle tone.     Coordination: Coordination normal.      ED Treatments / Results  Labs (all labs ordered are listed, but only abnormal results are displayed) Labs Reviewed - No data to display  EKG None  Radiology Dg Chest 2 View  Result Date: 10/05/2018 CLINICAL DATA:  Cough for 4 days.  Headache. EXAM: CHEST - 2 VIEW COMPARISON:  PA and lateral chest 09/23/2014. FINDINGS: Lungs clear. Heart size normal. No pneumothorax or pleural fluid. No acute or focal bony abnormality. IMPRESSION: Negative chest. Electronically Signed   By: Drusilla Kannerhomas  Dalessio M.D.   On: 10/05/2018 11:45    Procedures Procedures (including critical care time)  Medications Ordered in ED Medications  albuterol (PROVENTIL HFA;VENTOLIN HFA) 108 (90 Base) MCG/ACT inhaler 2 puff (2 puffs Inhalation Given 10/05/18 1228)     Initial Impression / Assessment and Plan / ED Course  I have reviewed the triage vital signs and the nursing notes.  Pertinent labs & imaging results that were available during my care of the patient were reviewed by me and considered in my medical decision making (see chart for details).      Pt is well appearing. Vitals reassuring.  PERC neg.  Headache associated with cough, no nuchal rigidity. No focal neuro deficits.  Albuterol MDI dispensed. She agrees to tx plan and close out pt f/u, return precautions discussed.    Final Clinical Impressions(s) / ED Diagnoses   Final diagnoses:  Bronchitis    ED Discharge Orders         Ordered    promethazine-dextromethorphan (PROMETHAZINE-DM) 6.25-15 MG/5ML syrup  4 times daily PRN     10/05/18 1205    predniSONE (DELTASONE) 20 MG tablet  Daily     10/05/18 1205           Kinmundyriplett, Babette Relicammy, PA-C 10/06/18 2057  Benjiman CorePickering, Nathan, MD 10/06/18 [id number]

## 2018-11-07 ENCOUNTER — Encounter: Payer: Self-pay | Admitting: General Practice

## 2019-01-30 ENCOUNTER — Encounter (HOSPITAL_COMMUNITY): Payer: Self-pay

## 2019-01-30 ENCOUNTER — Other Ambulatory Visit: Payer: Self-pay

## 2019-01-30 ENCOUNTER — Emergency Department (HOSPITAL_COMMUNITY)
Admission: EM | Admit: 2019-01-30 | Discharge: 2019-01-30 | Disposition: A | Discharge status: Home / Self Care | Payer: Managed Care, Other (non HMO) | Attending: Emergency Medicine | Admitting: Emergency Medicine

## 2019-01-30 ENCOUNTER — Emergency Department (HOSPITAL_COMMUNITY): Payer: Managed Care, Other (non HMO)

## 2019-01-30 DIAGNOSIS — I10 Essential (primary) hypertension: Secondary | ICD-10-CM | POA: Insufficient documentation

## 2019-01-30 DIAGNOSIS — J019 Acute sinusitis, unspecified: Secondary | ICD-10-CM | POA: Diagnosis not present

## 2019-01-30 DIAGNOSIS — F17210 Nicotine dependence, cigarettes, uncomplicated: Secondary | ICD-10-CM | POA: Diagnosis not present

## 2019-01-30 DIAGNOSIS — Z91040 Latex allergy status: Secondary | ICD-10-CM | POA: Diagnosis not present

## 2019-01-30 DIAGNOSIS — J45909 Unspecified asthma, uncomplicated: Secondary | ICD-10-CM | POA: Diagnosis not present

## 2019-01-30 DIAGNOSIS — R509 Fever, unspecified: Secondary | ICD-10-CM | POA: Diagnosis not present

## 2019-01-30 DIAGNOSIS — R059 Cough, unspecified: Secondary | ICD-10-CM

## 2019-01-30 DIAGNOSIS — R05 Cough: Secondary | ICD-10-CM | POA: Diagnosis present

## 2019-01-30 DIAGNOSIS — Z79899 Other long term (current) drug therapy: Secondary | ICD-10-CM | POA: Insufficient documentation

## 2019-01-30 DIAGNOSIS — Z20828 Contact with and (suspected) exposure to other viral communicable diseases: Secondary | ICD-10-CM | POA: Diagnosis not present

## 2019-01-30 MED ORDER — DOXYCYCLINE HYCLATE 100 MG PO CAPS: 100.0000 mg | ORAL_CAPSULE | Freq: Two times a day (BID) | ORAL | 0 refills | Status: DC

## 2019-01-30 NOTE — ED Provider Notes (Addendum)
[facility] EMERGENCY DEPARTMENT Provider Note   CSN: [id number] Arrival date & time: 01/30/19  7829    History   Chief Complaint Chief Complaint  Patient presents with  . Cough    HPI Vanessa Shelton is a 43 y.o. female presenting with a 3 day history of cough productive of clear sputum production, subjective fever, last measurement 99.3 yesterday along with sinus pressure, clear nasal discharge except for development of bloody streaking, generalized headache and fatigue.  She reports a history of sinus infection one year ago, and reports similar sx then except for todays cough.  She denies wheezing and sob, cough worsened when supine.  She denies cp, palpitations, n/v, abdominal pain. She reports mild throat soreness but believes this is from the coughing.   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  She has tried otc cough suppressants, Sudafed and allergy medications without improvement in sx.      The history is provided by the patient.    Past Medical History:  Diagnosis Date  . Asthma   . Tobacco abuse     Patient Active Problem List   Diagnosis Date Noted  . HTN (hypertension) 09/17/2015  . Obesity 09/17/2015  . Tobacco abuse 09/17/2015  . Fatigue 12/16/2012  . Well woman exam with routine gynecological exam 12/16/2012    Past Surgical History:  Procedure Laterality Date  . APPENDECTOMY    . CHOLECYSTECTOMY    . TUBAL LIGATION       OB History   No obstetric history on file.      Home Medications    Prior to Admission medications   Medication Sig Start Date End Date Taking? Authorizing Provider  chlorzoxazone (PARAFON FORTE DSC) 500 MG tablet Take 1 tablet (500 mg total) by mouth 3 (three) times daily as needed for muscle spasms. 09/16/17   Babs Sciara, MD  doxycycline (VIBRAMYCIN) 100 MG capsule Take 1 capsule (100 mg total) by mouth 2 (two) times daily. 01/30/19   Burgess Amor, PA-C  HYDROcodone-acetaminophen  (NORCO/VICODIN) 5-325 MG tablet Take 1 tablet by mouth every 4 (four) hours as needed. Patient not taking: Reported on 12/20/2017 12/10/16   Babs Sciara, MD  lisinopril (PRINIVIL,ZESTRIL) 5 MG tablet Take 1 tablet (5 mg total) by mouth daily. 12/20/17   Merlyn Albert, MD  predniSONE (DELTASONE) 20 MG tablet Take 2 tablets (40 mg total) by mouth daily. 10/05/18   Triplett, Tammy, PA-C  promethazine-dextromethorphan (PROMETHAZINE-DM) 6.25-15 MG/5ML syrup Take 5 mLs by mouth 4 (four) times daily as needed. 10/05/18   Pauline Aus, PA-C    Family History Family History  Problem Relation Age of Onset  . Hypertension Maternal Grandmother   . Heart attack Maternal Grandfather     Social History Social History   Tobacco Use  . Smoking status: Current Every Day Smoker    Packs/day: 1.00    Types: Cigarettes  . Smokeless tobacco: Never Used  Substance Use Topics  . Alcohol use: No  . Drug use: No     Allergies   Bee venom; Penicillins; and Latex   Review of Systems Review of Systems  Constitutional: Positive for fever. Negative for chills.  HENT: Positive for congestion, nosebleeds, postnasal drip, rhinorrhea, sinus pressure and sore throat. Negative for ear pain, facial swelling, sneezing, trouble swallowing and voice change.   Eyes: Negative for discharge.  Respiratory: Positive for cough. Negative for chest tightness, shortness of breath, wheezing and stridor.  Cardiovascular: Negative for chest pain.  Gastrointestinal: Negative for abdominal pain, nausea and vomiting.  Genitourinary: Negative.      Physical Exam Updated Vital Signs BP 128/72 (BP Location: Left Arm)   Pulse 72   Temp 98.1 F (36.7 C)   Resp 18   Ht 5\' 3"  (1.6 m)   Wt 101.2 kg   LMP 01/12/2019   SpO2 98%   BMI 39.50 kg/m   Physical Exam Constitutional:      Appearance: She is well-developed.  HENT:     Head: Normocephalic and atraumatic.     Right Ear: Tympanic membrane and ear canal  normal.     Left Ear: Tympanic membrane and ear canal normal.     Nose: Mucosal edema present.     Right Nostril: No epistaxis.     Left Nostril: No epistaxis.     Right Sinus: Maxillary sinus tenderness present.     Left Sinus: Maxillary sinus tenderness present.     Comments: Mild soreness bilateral maxilla.    Mouth/Throat:     Pharynx: Uvula midline. No pharyngeal swelling, oropharyngeal exudate or posterior oropharyngeal erythema.     Tonsils: No tonsillar exudate or tonsillar abscesses.  Eyes:     Conjunctiva/sclera: Conjunctivae normal.  Cardiovascular:     Rate and Rhythm: Normal rate.     Heart sounds: Normal heart sounds.  Pulmonary:     Effort: Pulmonary effort is normal. No respiratory distress.     Breath sounds: No stridor. No wheezing, rhonchi or rales.  Musculoskeletal: Normal range of motion.  Skin:    General: Skin is warm and dry.     Findings: No rash.  Neurological:     Mental Status: She is alert and oriented to person, place, and time.      ED Treatments / Results  Labs (all labs ordered are listed, but only abnormal results are displayed) Labs Reviewed  NOVEL CORONAVIRUS, NAA (HOSPITAL ORDER, SEND-OUT TO REF LAB)    EKG None  Radiology Dg Chest 2 View  Result Date: 01/30/2019 CLINICAL DATA:  Productive cough. EXAM: CHEST - 2 VIEW COMPARISON:  Radiographs of October 05, 2018. FINDINGS: The heart size and mediastinal contours are within normal limits. Left lung is clear. Minimal right midlung subsegmental atelectasis is noted. The visualized skeletal structures are unremarkable. IMPRESSION: Minimal right midlung subsegmental atelectasis. Electronically Signed   By: Lupita RaiderJames  Green Jr M.D.   On: 01/30/2019 10:36    Procedures Procedures (including critical care time)  Medications Ordered in ED Medications - No data to display   Initial Impression / Assessment and Plan / ED Course  I have reviewed the triage vital signs and the nursing notes.   Pertinent labs & imaging results that were available during my care of the patient were reviewed by me and considered in my medical decision making (see chart for details).        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  She was started on zithromax for acute sinusitis, encouraged continued sudafed and antihistamine.  She was advised home self quarantine until results of covid are back.  Pt understands and agrees with plan.   Vanessa Shelton was evaluated in Emergency Department on 01/30/2019 for the symptoms described in the history of present illness. She was evaluated in the context of the global COVID-19 pandemic, which necessitated consideration that the patient might be  at risk for infection with the SARS-CoV-2 virus that causes COVID-19. Institutional protocols and algorithms that pertain to the evaluation of patients at risk for COVID-19 are in a state of rapid change based on information released by regulatory bodies including the CDC and federal and state organizations. These policies and algorithms were followed during the patient's care in the ED.   Final Clinical Impressions(s) / ED Diagnoses   Final diagnoses:  Acute non-recurrent sinusitis, unspecified location  Cough  Fever, unspecified fever cause    ED Discharge Orders         Ordered    doxycycline (VIBRAMYCIN) 100 MG capsule  2 times daily     01/30/19 1136           Burgess Amor, PA-C 01/30/19 1142    Burgess Amor, PA-C 01/30/19 1145    Vanetta Mulders, MD 02/01/19 2124

## 2019-01-30 NOTE — ED Triage Notes (Signed)
Pt reports productive cough x 3 days. Low grade temp. 99.1 highest temp. Nasal drainage. Denies SOB

## 2019-01-30 NOTE — Discharge Instructions (Addendum)
As discussed, your symptoms may all be stemming from a sinus infection, but with your fever and cough, have also been screened for covid infection.  This test should result within the next 24-48 hours.  I recommend doing a self home quarantine until you know your Covid test is negative.  I recommend continuing use of sudafed and an allergy medicine such as claritin or zyrtec to help with your nasal congestion symptoms.  Complete the entire course of the antibiotic to treat this sinus infection.      Person Under Monitoring Name: Vanessa Shelton  Location: [address] [place] [address]   Infection Prevention Recommendations for Individuals Confirmed to have, or Being Evaluated for, 2019 Novel Coronavirus (COVID-19) Infection Who Receive Care at Home  Individuals who are confirmed to have, or are being evaluated for, COVID-19 should follow the prevention steps below until a healthcare provider or local or state health department says they can return to normal activities.  Stay home except to get medical care You should restrict activities outside your home, except for getting medical care. Do not go to work, school, or public areas, and do not use public transportation or taxis.  Call ahead before visiting your doctor Before your medical appointment, call the healthcare provider and tell them that you have, or are being evaluated for, COVID-19 infection. This will help the healthcare providers office take steps to keep other people from getting infected. Ask your healthcare provider to call the local or state health department.  Monitor your symptoms Seek prompt medical attention if your illness is worsening (e.g., difficulty breathing). Before going to your medical appointment, call the healthcare provider and tell them that you have, or are being evaluated for, COVID-19 infection. Ask your healthcare provider to call the local or state health department.  Wear a  facemask You should wear a facemask that covers your nose and mouth when you are in the same room with other people and when you visit a healthcare provider. People who live with or visit you should also wear a facemask while they are in the same room with you.  Separate yourself from other people in your home As much as possible, you should stay in a different room from other people in your home. Also, you should use a separate bathroom, if available.  Avoid sharing household items You should not share dishes, drinking glasses, cups, eating utensils, towels, bedding, or other items with other people in your home. After using these items, you should wash them thoroughly with soap and water.  Cover your coughs and sneezes Cover your mouth and nose with a tissue when you cough or sneeze, or you can cough or sneeze into your sleeve. Throw used tissues in a lined trash can, and immediately wash your hands with soap and water for at least 20 seconds or use an alcohol-based hand rub.  Wash your [employer] your hands often and thoroughly with soap and water for at least 20 seconds. You can use an alcohol-based hand sanitizer if soap and water are not available and if your hands are not visibly dirty. Avoid touching your eyes, nose, and mouth with unwashed hands.   Prevention Steps for Caregivers and Household Members of Individuals Confirmed to have, or Being Evaluated for, COVID-19 Infection Being Cared for in the Home  If you live with, or provide care at home for, a person confirmed to have, or being evaluated for, COVID-19 infection please follow these guidelines to  prevent infection:  Follow healthcare providers instructions Make sure that you understand and can help the patient follow any healthcare provider instructions for all care.  Provide for the patients basic needs You should help the patient with basic needs in the home and provide support for getting groceries,  prescriptions, and other personal needs.  Monitor the patients symptoms If they are getting sicker, call his or her medical provider and tell them that the patient has, or is being evaluated for, COVID-19 infection. This will help the healthcare providers office take steps to keep other people from getting infected. Ask the healthcare provider to call the local or state health department.  Limit the number of people who have contact with the patient If possible, have only one caregiver for the patient. Other household members should stay in another home or place of residence. If this is not possible, they should stay in another room, or be separated from the patient as much as possible. Use a separate bathroom, if available. Restrict visitors who do not have an essential need to be in the home.  Keep older adults, very young children, and other sick people away from the patient Keep older adults, very young children, and those who have compromised immune systems or chronic health conditions away from the patient. This includes people with chronic heart, lung, or kidney conditions, diabetes, and cancer.  Ensure good ventilation Make sure that shared spaces in the home have good air flow, such as from an air conditioner or an opened window, weather permitting.  Wash your hands often Wash your hands often and thoroughly with soap and water for at least 20 seconds. You can use an alcohol based hand sanitizer if soap and water are not available and if your hands are not visibly dirty. Avoid touching your eyes, nose, and mouth with unwashed hands. Use disposable paper towels to dry your hands. If not available, use dedicated cloth towels and replace them when they become wet.  Wear a facemask and gloves Wear a disposable facemask at all times in the room and gloves when you touch or have contact with the patients blood, body fluids, and/or secretions or excretions, such as sweat, saliva,  sputum, nasal mucus, vomit, urine, or feces.  Ensure the mask fits over your nose and mouth tightly, and do not touch it during use. Throw out disposable facemasks and gloves after using them. Do not reuse. Wash your hands immediately after removing your facemask and gloves. If your personal clothing becomes contaminated, carefully remove clothing and launder. Wash your hands after handling contaminated clothing. Place all used disposable facemasks, gloves, and other waste in a lined container before disposing them with other household waste. Remove gloves and wash your hands immediately after handling these items.  Do not share dishes, glasses, or other household items with the patient Avoid sharing household items. You should not share dishes, drinking glasses, cups, eating utensils, towels, bedding, or other items with a patient who is confirmed to have, or being evaluated for, COVID-19 infection. After the person uses these items, you should wash them thoroughly with soap and water.  Wash laundry thoroughly Immediately remove and wash clothes or bedding that have blood, body fluids, and/or secretions or excretions, such as sweat, saliva, sputum, nasal mucus, vomit, urine, or feces, on them. Wear gloves when handling laundry from the patient. Read and follow directions on labels of laundry or clothing items and detergent. In general, wash and dry with the warmest temperatures recommended on  the label.  Clean all areas the individual has used often Clean all touchable surfaces, such as counters, tabletops, doorknobs, bathroom fixtures, toilets, phones, keyboards, tablets, and bedside tables, every day. Also, clean any surfaces that may have blood, body fluids, and/or secretions or excretions on them. Wear gloves when cleaning surfaces the patient has come in contact with. Use a diluted bleach solution (e.g., dilute bleach with 1 part bleach and 10 parts water) or a household disinfectant with a  label that says EPA-registered for coronaviruses. To make a bleach solution at home, add 1 tablespoon of bleach to 1 quart (4 cups) of water. For a larger supply, add  cup of bleach to 1 gallon (16 cups) of water. Read labels of cleaning products and follow recommendations provided on product labels. Labels contain instructions for safe and effective use of the cleaning product including precautions you should take when applying the product, such as wearing gloves or eye protection and making sure you have good ventilation during use of the product. Remove gloves and wash hands immediately after cleaning.  Monitor yourself for signs and symptoms of illness Caregivers and household members are considered close contacts, should monitor their health, and will be asked to limit movement outside of the home to the extent possible. Follow the monitoring steps for close contacts listed on the symptom monitoring form.   ? If you have additional questions, contact your local health department or call the epidemiologist on call at [phone] (available 24/7). ? This guidance is subject to change. For the most up-to-date guidance from [facility], please refer to their website: TripMetro.huhttps://www.cdc.gov/coronavirus/2019-ncov/hcp/guidance-prevent-spread.html

## 2019-01-31 LAB — NOVEL CORONAVIRUS, NAA (HOSP ORDER, SEND-OUT TO REF LAB; TAT 18-24 HRS): SARS-CoV-2, NAA: NOT DETECTED

## 2019-02-20 ENCOUNTER — Other Ambulatory Visit: Payer: Self-pay | Admitting: Internal Medicine

## 2019-02-20 DIAGNOSIS — Z1231 Encounter for screening mammogram for malignant neoplasm of breast: Secondary | ICD-10-CM

## 2019-08-24 IMAGING — DX CHEST - 2 VIEW
2 series · 2 of 2 positions shown · non-contrast
Comparison: Radiographs October 05, 2018.

CLINICAL DATA: Productive cough.

EXAM:
CHEST - 2 VIEW

[chest pa]
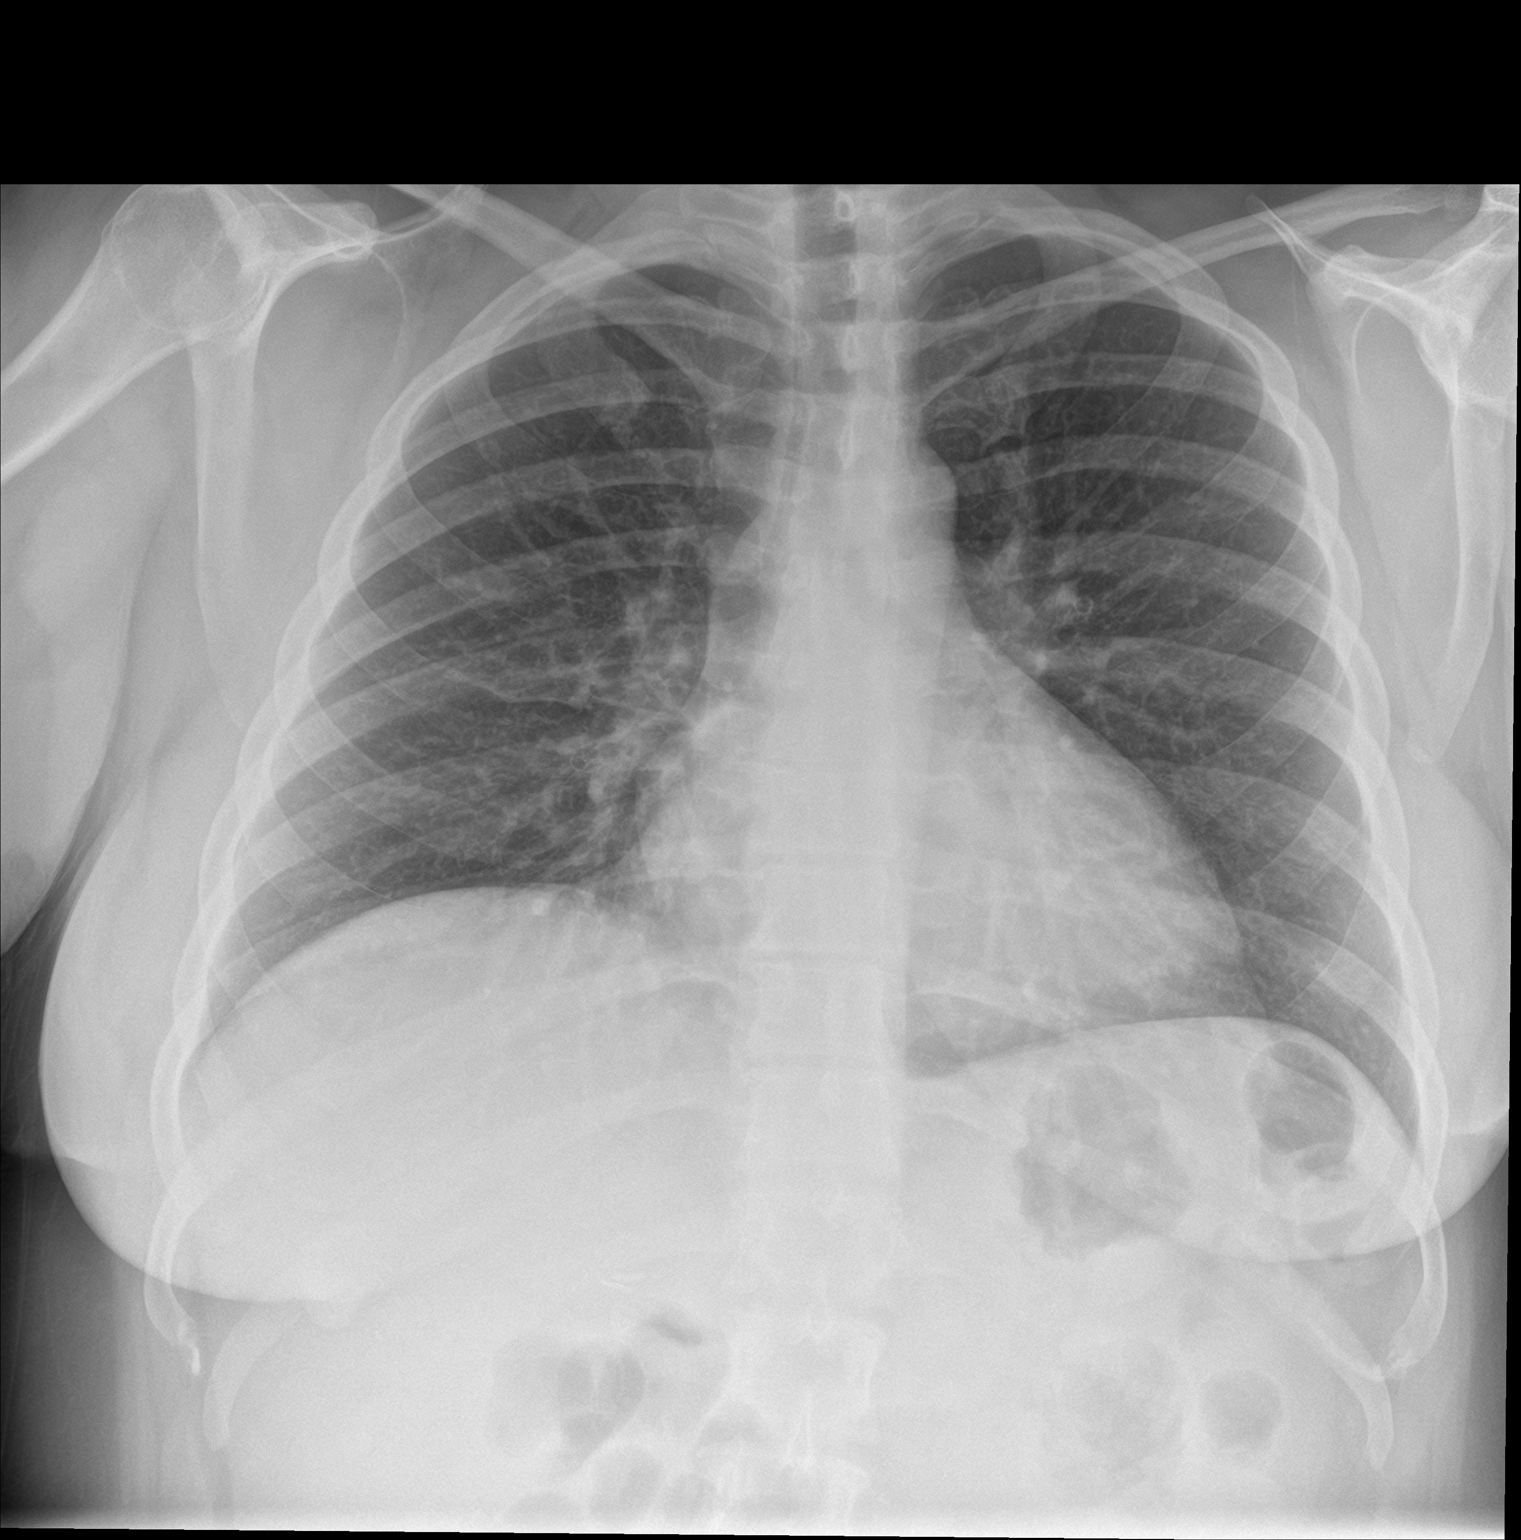

[chest lat]
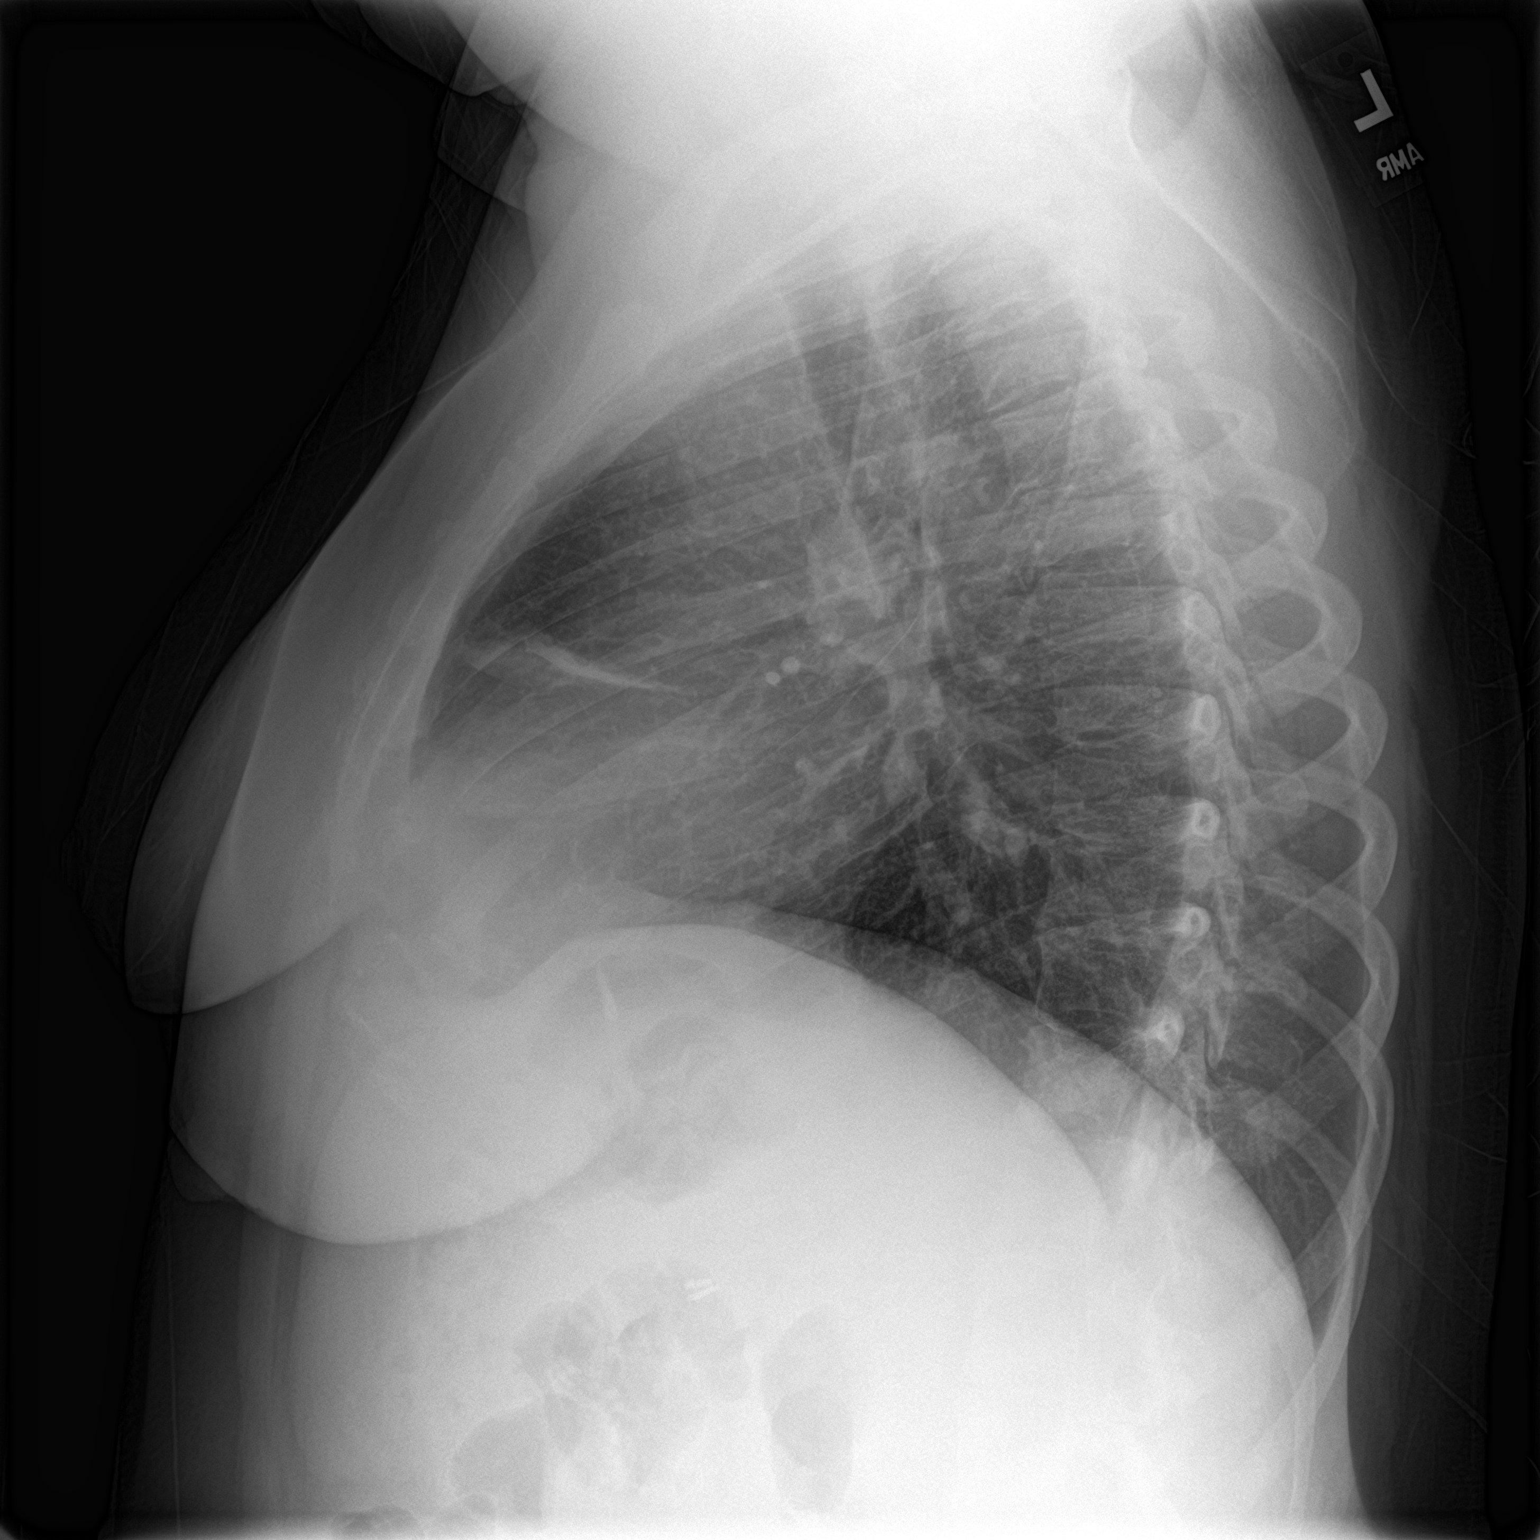

[2 of 2 positions shown; findings below may reference images not displayed]

FINDINGS: The heart size and mediastinal contours are within normal limits.
Left lung is clear. Minimal right midlung subsegmental atelectasis
is noted. The visualized skeletal structures are unremarkable.
IMPRESSION: Minimal right midlung subsegmental atelectasis.

## 2019-10-18 DIAGNOSIS — E139 Other specified diabetes mellitus without complications: Secondary | ICD-10-CM | POA: Insufficient documentation

## 2020-10-28 DIAGNOSIS — J0100 Acute maxillary sinusitis, unspecified: Secondary | ICD-10-CM | POA: Diagnosis not present

## 2020-10-28 DIAGNOSIS — B373 Candidiasis of vulva and vagina: Secondary | ICD-10-CM | POA: Diagnosis not present

## 2020-12-25 DIAGNOSIS — Z20822 Contact with and (suspected) exposure to covid-19: Secondary | ICD-10-CM | POA: Diagnosis not present

## 2020-12-25 DIAGNOSIS — J069 Acute upper respiratory infection, unspecified: Secondary | ICD-10-CM | POA: Diagnosis not present

## 2020-12-25 DIAGNOSIS — R3589 Other polyuria: Secondary | ICD-10-CM | POA: Diagnosis not present

## 2020-12-25 DIAGNOSIS — R945 Abnormal results of liver function studies: Secondary | ICD-10-CM | POA: Diagnosis not present

## 2020-12-25 DIAGNOSIS — J0100 Acute maxillary sinusitis, unspecified: Secondary | ICD-10-CM | POA: Diagnosis not present

## 2020-12-25 DIAGNOSIS — B373 Candidiasis of vulva and vagina: Secondary | ICD-10-CM | POA: Diagnosis not present

## 2020-12-25 DIAGNOSIS — E1165 Type 2 diabetes mellitus with hyperglycemia: Secondary | ICD-10-CM | POA: Diagnosis not present

## 2020-12-29 ENCOUNTER — Other Ambulatory Visit (HOSPITAL_COMMUNITY): Payer: Self-pay | Admitting: Internal Medicine

## 2020-12-29 ENCOUNTER — Other Ambulatory Visit: Payer: Self-pay | Admitting: Internal Medicine

## 2021-01-28 DIAGNOSIS — E559 Vitamin D deficiency, unspecified: Secondary | ICD-10-CM | POA: Insufficient documentation

## 2021-01-28 DIAGNOSIS — E782 Mixed hyperlipidemia: Secondary | ICD-10-CM | POA: Insufficient documentation

## 2021-01-28 DIAGNOSIS — R945 Abnormal results of liver function studies: Secondary | ICD-10-CM | POA: Insufficient documentation

## 2021-01-28 DIAGNOSIS — E1165 Type 2 diabetes mellitus with hyperglycemia: Secondary | ICD-10-CM | POA: Insufficient documentation

## 2021-01-28 DIAGNOSIS — E119 Type 2 diabetes mellitus without complications: Secondary | ICD-10-CM | POA: Insufficient documentation

## 2021-01-29 ENCOUNTER — Other Ambulatory Visit (HOSPITAL_COMMUNITY): Payer: Self-pay | Admitting: Family Medicine

## 2021-01-29 ENCOUNTER — Other Ambulatory Visit: Payer: Self-pay | Admitting: Family Medicine

## 2021-01-29 DIAGNOSIS — E785 Hyperlipidemia, unspecified: Secondary | ICD-10-CM | POA: Diagnosis not present

## 2021-01-29 DIAGNOSIS — R748 Abnormal levels of other serum enzymes: Secondary | ICD-10-CM

## 2021-01-29 DIAGNOSIS — E559 Vitamin D deficiency, unspecified: Secondary | ICD-10-CM | POA: Diagnosis not present

## 2021-01-29 DIAGNOSIS — E1165 Type 2 diabetes mellitus with hyperglycemia: Secondary | ICD-10-CM | POA: Diagnosis not present

## 2021-01-29 DIAGNOSIS — R945 Abnormal results of liver function studies: Secondary | ICD-10-CM

## 2021-01-29 DIAGNOSIS — K7689 Other specified diseases of liver: Secondary | ICD-10-CM | POA: Diagnosis not present

## 2021-03-26 DIAGNOSIS — E1165 Type 2 diabetes mellitus with hyperglycemia: Secondary | ICD-10-CM | POA: Diagnosis not present

## 2021-03-26 DIAGNOSIS — K7689 Other specified diseases of liver: Secondary | ICD-10-CM | POA: Diagnosis not present

## 2021-03-26 DIAGNOSIS — E785 Hyperlipidemia, unspecified: Secondary | ICD-10-CM | POA: Diagnosis not present

## 2021-03-30 DIAGNOSIS — R7301 Impaired fasting glucose: Secondary | ICD-10-CM | POA: Insufficient documentation

## 2021-03-30 DIAGNOSIS — F17200 Nicotine dependence, unspecified, uncomplicated: Secondary | ICD-10-CM | POA: Insufficient documentation

## 2021-03-30 DIAGNOSIS — J40 Bronchitis, not specified as acute or chronic: Secondary | ICD-10-CM | POA: Insufficient documentation

## 2021-03-30 DIAGNOSIS — G4730 Sleep apnea, unspecified: Secondary | ICD-10-CM | POA: Insufficient documentation

## 2021-03-30 DIAGNOSIS — N393 Stress incontinence (female) (male): Secondary | ICD-10-CM | POA: Insufficient documentation

## 2021-03-31 DIAGNOSIS — E1165 Type 2 diabetes mellitus with hyperglycemia: Secondary | ICD-10-CM | POA: Diagnosis not present

## 2021-03-31 DIAGNOSIS — E782 Mixed hyperlipidemia: Secondary | ICD-10-CM | POA: Diagnosis not present

## 2021-03-31 DIAGNOSIS — G4730 Sleep apnea, unspecified: Secondary | ICD-10-CM | POA: Diagnosis not present

## 2021-06-17 ENCOUNTER — Telehealth: Payer: Self-pay | Admitting: Family Medicine

## 2021-06-17 NOTE — Telephone Encounter (Signed)
Return call to patient. 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 No medical concerns related to self.  Encounter closed.

## 2021-06-17 NOTE — Telephone Encounter (Signed)
Patient calling regarding her FMLA papers

## 2021-10-15 DIAGNOSIS — E669 Obesity, unspecified: Secondary | ICD-10-CM | POA: Diagnosis not present

## 2021-10-15 DIAGNOSIS — E1159 Type 2 diabetes mellitus with other circulatory complications: Secondary | ICD-10-CM | POA: Diagnosis not present

## 2021-10-15 DIAGNOSIS — I152 Hypertension secondary to endocrine disorders: Secondary | ICD-10-CM | POA: Diagnosis not present

## 2021-10-15 DIAGNOSIS — E1169 Type 2 diabetes mellitus with other specified complication: Secondary | ICD-10-CM | POA: Diagnosis not present

## 2021-11-12 DIAGNOSIS — I152 Hypertension secondary to endocrine disorders: Secondary | ICD-10-CM | POA: Diagnosis not present

## 2021-11-12 DIAGNOSIS — E669 Obesity, unspecified: Secondary | ICD-10-CM | POA: Diagnosis not present

## 2021-11-12 DIAGNOSIS — E1159 Type 2 diabetes mellitus with other circulatory complications: Secondary | ICD-10-CM | POA: Diagnosis not present

## 2021-11-12 DIAGNOSIS — E1169 Type 2 diabetes mellitus with other specified complication: Secondary | ICD-10-CM | POA: Diagnosis not present

## 2021-12-15 DIAGNOSIS — I152 Hypertension secondary to endocrine disorders: Secondary | ICD-10-CM | POA: Diagnosis not present

## 2021-12-15 DIAGNOSIS — E1169 Type 2 diabetes mellitus with other specified complication: Secondary | ICD-10-CM | POA: Diagnosis not present

## 2021-12-15 DIAGNOSIS — E1159 Type 2 diabetes mellitus with other circulatory complications: Secondary | ICD-10-CM | POA: Diagnosis not present

## 2021-12-15 DIAGNOSIS — E669 Obesity, unspecified: Secondary | ICD-10-CM | POA: Diagnosis not present

## 2021-12-18 DIAGNOSIS — Z7721 Contact with and (suspected) exposure to potentially hazardous body fluids: Secondary | ICD-10-CM | POA: Diagnosis not present

## 2022-01-21 DIAGNOSIS — E669 Obesity, unspecified: Secondary | ICD-10-CM | POA: Diagnosis not present

## 2022-01-21 DIAGNOSIS — E1169 Type 2 diabetes mellitus with other specified complication: Secondary | ICD-10-CM | POA: Diagnosis not present

## 2022-01-21 DIAGNOSIS — E1159 Type 2 diabetes mellitus with other circulatory complications: Secondary | ICD-10-CM | POA: Diagnosis not present

## 2022-01-21 DIAGNOSIS — I152 Hypertension secondary to endocrine disorders: Secondary | ICD-10-CM | POA: Diagnosis not present

## 2022-03-02 ENCOUNTER — Ambulatory Visit
Admission: EM | Admit: 2022-03-02 | Discharge: 2022-03-02 | Disposition: A | Discharge status: Home / Self Care | Payer: BC Managed Care – PPO

## 2022-03-02 ENCOUNTER — Ambulatory Visit (INDEPENDENT_AMBULATORY_CARE_PROVIDER_SITE_OTHER): Payer: BC Managed Care – PPO

## 2022-03-02 DIAGNOSIS — R051 Acute cough: Secondary | ICD-10-CM

## 2022-03-02 DIAGNOSIS — R071 Chest pain on breathing: Secondary | ICD-10-CM

## 2022-03-02 DIAGNOSIS — R052 Subacute cough: Secondary | ICD-10-CM

## 2022-03-02 DIAGNOSIS — R059 Cough, unspecified: Secondary | ICD-10-CM | POA: Diagnosis not present

## 2022-03-02 MED ORDER — PROMETHAZINE-DM 6.25-15 MG/5ML PO SYRP: 5.0000 mL | ORAL_SOLUTION | Freq: Every evening | ORAL | 0 refills | Status: DC | PRN

## 2022-10-22 DIAGNOSIS — Z6838 Body mass index (BMI) 38.0-38.9, adult: Secondary | ICD-10-CM | POA: Diagnosis not present

## 2022-10-22 DIAGNOSIS — J019 Acute sinusitis, unspecified: Secondary | ICD-10-CM | POA: Diagnosis not present

## 2022-10-22 DIAGNOSIS — R030 Elevated blood-pressure reading, without diagnosis of hypertension: Secondary | ICD-10-CM | POA: Diagnosis not present

## 2022-10-22 DIAGNOSIS — E669 Obesity, unspecified: Secondary | ICD-10-CM | POA: Diagnosis not present

## 2022-11-04 ENCOUNTER — Encounter: Payer: Self-pay | Admitting: Family Medicine

## 2022-11-04 ENCOUNTER — Ambulatory Visit (INDEPENDENT_AMBULATORY_CARE_PROVIDER_SITE_OTHER): Payer: BC Managed Care – PPO | Admitting: Family Medicine

## 2022-11-04 VITALS — BP 138/84 | HR 83 | Ht 63.0 in | Wt 219.0 lb

## 2022-11-04 DIAGNOSIS — E785 Hyperlipidemia, unspecified: Secondary | ICD-10-CM | POA: Diagnosis not present

## 2022-11-04 DIAGNOSIS — E119 Type 2 diabetes mellitus without complications: Secondary | ICD-10-CM

## 2022-11-04 DIAGNOSIS — F32A Depression, unspecified: Secondary | ICD-10-CM | POA: Insufficient documentation

## 2022-11-04 DIAGNOSIS — Z114 Encounter for screening for human immunodeficiency virus [HIV]: Secondary | ICD-10-CM | POA: Diagnosis not present

## 2022-11-04 DIAGNOSIS — R7301 Impaired fasting glucose: Secondary | ICD-10-CM

## 2022-11-04 DIAGNOSIS — E039 Hypothyroidism, unspecified: Secondary | ICD-10-CM | POA: Diagnosis not present

## 2022-11-04 DIAGNOSIS — E1165 Type 2 diabetes mellitus with hyperglycemia: Secondary | ICD-10-CM

## 2022-11-04 DIAGNOSIS — F339 Major depressive disorder, recurrent, unspecified: Secondary | ICD-10-CM

## 2022-11-04 DIAGNOSIS — I10 Essential (primary) hypertension: Secondary | ICD-10-CM | POA: Diagnosis not present

## 2022-11-04 MED ORDER — BUPROPION HCL ER (XL) 150 MG PO TB24: 150.0000 mg | ORAL_TABLET | Freq: Every day | ORAL | 1 refills | Status: DC

## 2022-11-04 NOTE — Progress Notes (Signed)
Patient Office Visit   Subjective   Patient ID: Vanessa Shelton, female    DOB: 05-27-1976  Age: 47 y.o. MRN: [id number]  CC:  Chief Complaint  Patient presents with   Establish Care   Diabetes    HPI Vanessa Shelton 47 year old female, presents to establish care. She  has a past medical history of Asthma, Diabetes mellitus without complication ([place]), and Tobacco abuse.  HX of uncontrolled diabetes: She presents for her initial diabetic visit. She has type 2 diabetes mellitus. Her disease course has been fluctuating. Symptoms include headaches and nervousness/anxiousness. Pertinent negatives for hypoglycemia include no tremors. Associated symptoms include blurred vision, fatigue, polyuria and weight loss. Pertinent negatives for diabetes include no chest pain, no foot paresthesias, no foot ulcerations and no visual change. Diabetic complications include peripheral neuropathy. Risk factors for coronary artery disease include obesity, stress, tobacco exposure, hypertension, diabetes mellitus and sedentary lifestyle. When asked about current treatments, none were reported. She is following a low salt diet. Meal planning includes avoidance of concentrated sweets and carbohydrate counting. She has not had a previous visit with a dietitian. She rarely participates in exercise. Home blood sugar record trend: Fasting 202 216 214, after eating 286, 183. She does not see a podiatrist. Eye exam is not current.   Depression      The patient presents with depression.  This is a chronic problem.  The problem occurs constantly.The problem is unchanged.  Associated symptoms include fatigue, helplessness, hopelessness, insomnia, decreased interest and headaches.     The symptoms are aggravated by work stress and family issues.  Past treatments include nothing.  Risk factors include family history, stress and major life event.   Past medical history includes anxiety and depression.         Outpatient  Encounter Medications as of 11/04/2022  Medication Sig   buPROPion (WELLBUTRIN XL) 150 MG 24 hr tablet Take 1 tablet (150 mg total) by mouth daily.   glucose blood test strip OneTouch Verio test strips  FOR USE IN BLOOD GLUCOSE METER   [DISCONTINUED] fexofenadine (ALLEGRA) 60 MG tablet Take 60 mg by mouth 2 (two) times daily.   metFORMIN (GLUCOPHAGE) 500 MG tablet metformin 500 mg tablet  TAKE 1 TABLET BY MOUTH TWICE A DAY (Patient not taking: Reported on 11/04/2022)   Pseudoephedrine HCl (SUDAFED 12 HOUR PO) Take by mouth.   [DISCONTINUED] chlorzoxazone (PARAFON FORTE DSC) 500 MG tablet Take 1 tablet (500 mg total) by mouth 3 (three) times daily as needed for muscle spasms.   [DISCONTINUED] HYDROcodone-acetaminophen (NORCO/VICODIN) 5-325 MG tablet Take 1 tablet by mouth every 4 (four) hours as needed.   [DISCONTINUED] lisinopril (PRINIVIL,ZESTRIL) 5 MG tablet Take 1 tablet (5 mg total) by mouth daily.   [DISCONTINUED] promethazine-dextromethorphan (PROMETHAZINE-DM) 6.25-15 MG/5ML syrup Take 5 mLs by mouth at bedtime as needed for cough. Do not take with alcohol or while driving or operating heavy machinery   No facility-administered encounter medications on file as of 11/04/2022.    Past Surgical History:  Procedure Laterality Date   APPENDECTOMY     CHOLECYSTECTOMY     TUBAL LIGATION      Review of Systems  Constitutional:  Negative for chills and fever.  HENT:  Negative for ear pain.   Eyes:  Negative for blurred vision.  Respiratory:  Negative for sputum production.   Cardiovascular:  Negative for chest pain.  Gastrointestinal:  Negative for abdominal pain, nausea and vomiting.  Genitourinary:  Positive for  frequency.  Skin:  Negative for rash.  Neurological:  Positive for headaches.  Psychiatric/Behavioral:  Positive for depression.       Objective    BP 138/84   Pulse 83   Ht '5\' 3"'$  (1.6 m)   Wt 219 lb (99.3 kg)   SpO2 96%   BMI 38.79 kg/m   Physical  Exam Constitutional:      Appearance: She is obese.  HENT:     Head: Normocephalic.  Cardiovascular:     Rate and Rhythm: Regular rhythm.     Pulses: Normal pulses.  Pulmonary:     Effort: Pulmonary effort is normal.     Breath sounds: Normal breath sounds.  Musculoskeletal:        General: Normal range of motion.     Right lower leg: No edema.     Left lower leg: No edema.  Skin:    General: Skin is warm and dry.     Capillary Refill: Capillary refill takes less than 2 seconds.  Neurological:     Mental Status: She is alert and oriented to person, place, and time.  Psychiatric:        Thought Content: Thought content normal.       Assessment & Plan:  Depression, recurrent [facility]) Assessment & Plan: [place] Office Visit from 11/04/2022 in [facility]  PHQ-9 Total Score 20      Prescribed Wellbutrin 150 mg daily  Discussed non pharmacological interventions such seeing a therapist, support system, diet, exercise, sleep. Follow up in 4-6 weeks or sooner if needed. Patient verbalizes understanding regarding plan of care and all questions answered.   Orders: -     buPROPion HCl ER (XL); Take 1 tablet (150 mg total) by mouth daily.  Dispense: 30 tablet; Refill: 1  Primary hypertension -     CBC with Differential/Platelet -     CMP14+EGFR -     Microalbumin / creatinine urine ratio  Hyperlipidemia, unspecified hyperlipidemia type -     Lipid panel  Hypothyroidism, unspecified type -     TSH + free T4  IFG (impaired fasting glucose) -     Hemoglobin A1c  Screening for HIV (human immunodeficiency virus) -     HIV Antibody (routine testing w rflx)  Type 2 diabetes mellitus without complication, without long-term current use of insulin ([place]) Assessment & Plan: Labs ordered, will follow up with Hemoglobin A 1c level and medication management.  Discussed non pharmacological interventions such as low carb diet,high in vegetables and fruit  discussed. Educated on importance of physical activity. Discussed signs and symptoms of hypoglycemia and need to present to the ED. Follow up in 3 months or sooner if needed. Patient verbalizes understanding regarding plan of care and all questions answered. Ophthalmology exam in April 2024, Foot exam within desired limits      Return in 2 weeks (on 11/18/2022) for hypertension, re-check blood pressure,foot  pain.   Vanessa Shelton Ria Comment, FNP

## 2022-11-04 NOTE — Patient Instructions (Signed)
It was pleasure meeting with you today. Please take medications as prescribed. Follow up with your primary health provider if any health concerns arises. If symptoms worsen please contact your primary care provider and/or visit the emergency department.

## 2022-11-04 NOTE — Assessment & Plan Note (Addendum)
[place] Office Visit from 11/04/2022 in [facility]  PHQ-9 Total Score 20      Prescribed Wellbutrin 150 mg daily  Discussed non pharmacological interventions such seeing a therapist, support system, diet, exercise, sleep. Follow up in 4-6 weeks or sooner if needed. Patient verbalizes understanding regarding plan of care and all questions answered.

## 2022-11-04 NOTE — Assessment & Plan Note (Signed)
Labs ordered, will follow up with Hemoglobin A 1c level and medication management.  Discussed non pharmacological interventions such as low carb diet,high in vegetables and fruit discussed. Educated on importance of physical activity. Discussed signs and symptoms of hypoglycemia and need to present to the ED. Follow up in 3 months or sooner if needed. Patient verbalizes understanding regarding plan of care and all questions answered. Ophthalmology exam in April 2024, Foot exam within desired limits

## 2022-11-05 ENCOUNTER — Other Ambulatory Visit: Payer: Self-pay | Admitting: Family Medicine

## 2022-11-05 DIAGNOSIS — E119 Type 2 diabetes mellitus without complications: Secondary | ICD-10-CM

## 2022-11-05 DIAGNOSIS — E785 Hyperlipidemia, unspecified: Secondary | ICD-10-CM

## 2022-11-05 MED ORDER — BLOOD GLUCOSE MONITORING SUPPL DEVI: 1.0000 | Freq: Three times a day (TID) | 0 refills | Status: AC

## 2022-11-05 MED ORDER — LANCET DEVICE MISC: 1.0000 | Freq: Three times a day (TID) | 0 refills | Status: AC

## 2022-11-05 MED ORDER — ROSUVASTATIN CALCIUM 5 MG PO TABS: 5.0000 mg | ORAL_TABLET | Freq: Every day | ORAL | 3 refills | Status: DC

## 2022-11-05 MED ORDER — METFORMIN HCL 1000 MG PO TABS: 1000.0000 mg | ORAL_TABLET | Freq: Two times a day (BID) | ORAL | 3 refills | Status: DC

## 2022-11-05 MED ORDER — TRULICITY 0.75 MG/0.5ML ~~LOC~~ SOAJ: 0.7500 mg | SUBCUTANEOUS | 3 refills | Status: DC

## 2022-11-05 MED ORDER — BLOOD GLUCOSE TEST VI STRP: 1.0000 | ORAL_STRIP | Freq: Three times a day (TID) | 3 refills | Status: AC

## 2022-11-05 MED ORDER — GLIMEPIRIDE 1 MG PO TABS: 1.0000 mg | ORAL_TABLET | Freq: Every day | ORAL | 3 refills | Status: DC

## 2022-11-05 MED ORDER — LANCETS MISC. MISC: 1.0000 | Freq: Three times a day (TID) | 0 refills | Status: AC

## 2022-11-07 LAB — CBC WITH DIFFERENTIAL/PLATELET
Basophils Absolute: 0.1 10*3/uL (ref 0.0–0.2)
Basos: 1 %
EOS (ABSOLUTE): 0.7 10*3/uL — ABNORMAL HIGH (ref 0.0–0.4)
Eos: 7 %
Hematocrit: 44.3 % (ref 34.0–46.6)
Hemoglobin: 14.5 g/dL (ref 11.1–15.9)
Immature Grans (Abs): 0 10*3/uL (ref 0.0–0.1)
Immature Granulocytes: 0 %
Lymphocytes Absolute: 2.2 10*3/uL (ref 0.7–3.1)
Lymphs: 23 %
MCH: 31.2 pg (ref 26.6–33.0)
MCHC: 32.7 g/dL (ref 31.5–35.7)
MCV: 95 fL (ref 79–97)
Monocytes Absolute: 0.5 10*3/uL (ref 0.1–0.9)
Monocytes: 6 %
Neutrophils Absolute: 6.1 10*3/uL (ref 1.4–7.0)
Neutrophils: 63 %
Platelets: 355 10*3/uL (ref 150–450)
RBC: 4.65 x10E6/uL (ref 3.77–5.28)
RDW: 11.7 % (ref 11.7–15.4)
WBC: 9.5 10*3/uL (ref 3.4–10.8)

## 2022-11-07 LAB — TSH+FREE T4
Free T4: 1.29 ng/dL (ref 0.82–1.77)
TSH: 0.945 u[IU]/mL (ref 0.450–4.500)

## 2022-11-07 LAB — CMP14+EGFR
ALT: 58 IU/L — ABNORMAL HIGH (ref 0–32)
AST: 33 IU/L (ref 0–40)
Albumin/Globulin Ratio: 1.8 (ref 1.2–2.2)
Albumin: 4.5 g/dL (ref 3.9–4.9)
Alkaline Phosphatase: 101 IU/L (ref 44–121)
BUN/Creatinine Ratio: 14 (ref 9–23)
BUN: 8 mg/dL (ref 6–24)
Bilirubin Total: 0.5 mg/dL (ref 0.0–1.2)
CO2: 19 mmol/L — ABNORMAL LOW (ref 20–29)
Calcium: 9.4 mg/dL (ref 8.7–10.2)
Chloride: 105 mmol/L (ref 96–106)
Creatinine, Ser: 0.56 mg/dL — ABNORMAL LOW (ref 0.57–1.00)
Globulin, Total: 2.5 g/dL (ref 1.5–4.5)
Glucose: 187 mg/dL — ABNORMAL HIGH (ref 70–99)
Potassium: 4.5 mmol/L (ref 3.5–5.2)
Sodium: 140 mmol/L (ref 134–144)
Total Protein: 7 g/dL (ref 6.0–8.5)
eGFR: 115 mL/min/{1.73_m2} (ref >59)

## 2022-11-07 LAB — LIPID PANEL
Chol/HDL Ratio: 4.3 ratio (ref 0.0–4.4)
Cholesterol, Total: 171 mg/dL (ref 100–199)
HDL: 40 mg/dL (ref >39)
LDL Chol Calc (NIH): 107 mg/dL — ABNORMAL HIGH (ref 0–99)
Triglycerides: 133 mg/dL (ref 0–149)
VLDL Cholesterol Cal: 24 mg/dL (ref 5–40)

## 2022-11-07 LAB — MICROALBUMIN / CREATININE URINE RATIO
Creatinine, Urine: 63.6 mg/dL
Microalb/Creat Ratio: 11 mg/g creat (ref 0–29)
Microalbumin, Urine: 6.9 ug/mL

## 2022-11-07 LAB — HEMOGLOBIN A1C
Est. average glucose Bld gHb Est-mCnc: 258 mg/dL
Hgb A1c MFr Bld: 10.6 % — ABNORMAL HIGH (ref 4.8–5.6)

## 2022-11-07 LAB — HIV ANTIBODY (ROUTINE TESTING W REFLEX): HIV Screen 4th Generation wRfx: NONREACTIVE

## 2022-11-18 ENCOUNTER — Encounter: Payer: Self-pay | Admitting: Family Medicine

## 2022-11-18 ENCOUNTER — Ambulatory Visit (INDEPENDENT_AMBULATORY_CARE_PROVIDER_SITE_OTHER): Payer: BC Managed Care – PPO | Admitting: Family Medicine

## 2022-11-18 VITALS — BP 125/83 | HR 72 | Ht 63.0 in | Wt 215.0 lb

## 2022-11-18 DIAGNOSIS — M545 Low back pain, unspecified: Secondary | ICD-10-CM | POA: Diagnosis not present

## 2022-11-18 DIAGNOSIS — I10 Essential (primary) hypertension: Secondary | ICD-10-CM | POA: Diagnosis not present

## 2022-11-18 DIAGNOSIS — G8929 Other chronic pain: Secondary | ICD-10-CM | POA: Diagnosis not present

## 2022-11-18 MED ORDER — CYCLOBENZAPRINE HCL 5 MG PO TABS: 5.0000 mg | ORAL_TABLET | Freq: Three times a day (TID) | ORAL | 1 refills | Status: AC | PRN

## 2022-11-18 NOTE — Assessment & Plan Note (Addendum)
Prescribed Flexeril 5 mg as initial therapy. Discussed Non pharmacological interventions include rest, avoid twisting, improper bending, straining lower back. Demonstration of proper body mechanics. Alternate ice and heat. Recommend stretching back and legs. Follow up for worsening or persistent symptoms. Patient verbalizes understanding regarding plan of care and all questions answered.  Follow up in 4 weeks

## 2022-11-18 NOTE — Patient Instructions (Signed)
It was pleasure meeting with you today. Please take medications as prescribed. Follow up with your primary health provider if any health concerns arises. If symptoms worsen please contact your primary care provider and/or visit the emergency department.  

## 2022-11-18 NOTE — Assessment & Plan Note (Addendum)
Vitals:   11/18/22 0807 11/18/22 0840  BP: 125/82 125/83  Blood pressure well controlled.         Blood pressure reading at home include 132/80, 127/71, 124/78, 124/79, 132/89, 125/83 Explained non pharmacological interventions such as low salt, DASH diet discussed. Educated on stress reduction and physical activity. Discussed signs and symptoms of major cardiovascular event and need to present to the ED. Follow up in Patient verbalizes understanding regarding plan of care and all questions answered.  No medication intervention in today's visit.

## 2022-11-18 NOTE — Assessment & Plan Note (Deleted)
Vitals:   11/18/22 0807 11/18/22 0840  BP: 125/82 125/83  Blood pressure well controlled.         Blood pressure reading at home include 132/80, 127/71, 124/78, 124/79, 132/89, 125/83 Explained non pharmacological interventions such as low salt, DASH diet discussed. Educated on stress reduction and physical activity. Discussed signs and symptoms of major cardiovascular event and need to present to the ED. Follow up in Patient verbalizes understanding regarding plan of care and all questions answered.

## 2022-11-18 NOTE — Progress Notes (Signed)
Patient Office Visit   Subjective   Patient ID: Vanessa Shelton, female    DOB: 1975-12-28  Age: 47 y.o. MRN: [id number]  CC:  Chief Complaint  Patient presents with   Hypertension    Patient has been keeping a BP log. Has been stable most days since last visit.     HPI Vanessa Shelton 47 year old female, presents to clinic for previous elevated blood pressure reading as per our last visit and chronic back pain. She  has a past medical history of Asthma, Diabetes mellitus without complication ([place]), and Tobacco abuse.  Patient here for follow-up of elevated blood pressure. She is not exercising and is adherent to low salt diet.  Blood pressure is well controlled at home. Patient denies cardiac symptoms chest pain, claudication, dyspnea, lower extremity edema, palpitations, syncope, and tachypnea.Cardiovascular risk factors: diabetes mellitus, dyslipidemia, obesity (BMI >= 30 kg/m2), sedentary lifestyle, and smoking/ tobacco exposure.   Back Pain This is a chronic problem. The problem occurs intermittently. The problem has been gradually worsening since onset. The pain is present in the lumbar spine. The quality of the pain is described as aching and cramping. The pain does not radiate. The pain is at a severity of 9/10. The pain is moderate. The pain is Worse during the day. The symptoms are aggravated by position, sitting, standing and stress. Pertinent negatives include no abdominal pain, bladder incontinence, bowel incontinence, numbness, paresis, paresthesias, pelvic pain, perianal numbness or weakness. Risk factors include obesity and lack of exercise. She has tried analgesics and NSAIDs for the symptoms. The treatment provided mild relief.     Outpatient Encounter Medications as of 11/18/2022  Medication Sig   Blood Glucose Monitoring Suppl DEVI 1 each by Does not apply route in the morning, at noon, and at bedtime. May substitute to any manufacturer covered by patient's insurance.    buPROPion (WELLBUTRIN XL) 150 MG 24 hr tablet Take 1 tablet (150 mg total) by mouth daily.   cyclobenzaprine (FLEXERIL) 5 MG tablet Take 1 tablet (5 mg total) by mouth 3 (three) times daily as needed for muscle spasms.   Dulaglutide (TRULICITY) A999333 0000000 SOPN Inject 0.75 mg into the skin once a week.   glimepiride (AMARYL) 1 MG tablet Take 1 tablet (1 mg total) by mouth daily with breakfast.   Glucose Blood (BLOOD GLUCOSE TEST STRIPS) STRP 1 each by In Vitro route in the morning, at noon, and at bedtime. May substitute to any manufacturer covered by patient's insurance.   Lancet Device MISC 1 each by Does not apply route in the morning, at noon, and at bedtime. May substitute to any manufacturer covered by patient's insurance.   Lancets (ONETOUCH DELICA PLUS Q000111Q) MISC 3 (three) times daily.   Lancets Misc. MISC 1 each by Does not apply route in the morning, at noon, and at bedtime. May substitute to any manufacturer covered by patient's insurance.   metFORMIN (GLUCOPHAGE) 1000 MG tablet Take 1 tablet (1,000 mg total) by mouth 2 (two) times daily with a meal.   Pseudoephedrine HCl (SUDAFED 12 HOUR PO) Take by mouth.   rosuvastatin (CRESTOR) 5 MG tablet Take 1 tablet (5 mg total) by mouth daily.   No facility-administered encounter medications on file as of 11/18/2022.    Past Surgical History:  Procedure Laterality Date   APPENDECTOMY     CHOLECYSTECTOMY     TUBAL LIGATION      Review of Systems  Constitutional:  Negative for chills.  Eyes:  Negative for blurred vision.  Respiratory:  Negative for shortness of breath.   Gastrointestinal:  Negative for abdominal pain, bowel incontinence, nausea and vomiting.  Genitourinary:  Negative for bladder incontinence and pelvic pain.  Musculoskeletal:  Positive for back pain.  Neurological:  Negative for dizziness, weakness, numbness and paresthesias.      Objective    BP 125/83   Pulse 72   Ht '5\' 3"'$  (1.6 m)   Wt 215 lb (97.5 kg)    SpO2 94%   BMI 38.09 kg/m   Physical Exam Vitals reviewed.  Cardiovascular:     Rate and Rhythm: Normal rate and regular rhythm.     Pulses: Normal pulses.     Heart sounds: Normal heart sounds.  Pulmonary:     Effort: Pulmonary effort is normal.     Breath sounds: Normal breath sounds.  Musculoskeletal:        General: Normal range of motion.     Lumbar back: No swelling. Normal range of motion. Negative right straight leg raise test and negative left straight leg raise test.  Skin:    General: Skin is warm and dry.     Capillary Refill: Capillary refill takes less than 2 seconds.  Neurological:     General: No focal deficit present.     Mental Status: She is alert.  Psychiatric:        Mood and Affect: Mood normal.       Assessment & Plan:  Chronic midline low back pain without sciatica Assessment & Plan: Prescribed Flexeril 5 mg as initial therapy. Discussed Non pharmacological interventions include rest, avoid twisting, improper bending, straining lower back. Demonstration of proper body mechanics. Alternate ice and heat. Recommend stretching back and legs. Follow up for worsening or persistent symptoms. Patient verbalizes understanding regarding plan of care and all questions answered.  Follow up in 4 weeks  Orders: -     Cyclobenzaprine HCl; Take 1 tablet (5 mg total) by mouth 3 (three) times daily as needed for muscle spasms.  Dispense: 30 tablet; Refill: 1  Initial high blood pressure determined by examination Assessment & Plan: Vitals:   11/18/22 0807 11/18/22 0840  BP: 125/82 125/83  Blood pressure well controlled.         Blood pressure reading at home include 132/80, 127/71, 124/78, 124/79, 132/89, 125/83 Explained non pharmacological interventions such as low salt, DASH diet discussed. Educated on stress reduction and physical activity. Discussed signs and symptoms of major cardiovascular event and need to present to the ED. Follow up in Patient verbalizes  understanding regarding plan of care and all questions answered.  No medication intervention in today's visit.     Return in about 1 month (around 12/19/2022) for Pap smear, Anxiety, Depression.   Renard Hamper Ria Comment, FNP

## 2022-11-23 ENCOUNTER — Encounter: Payer: Self-pay | Admitting: Family Medicine

## 2022-11-24 ENCOUNTER — Other Ambulatory Visit: Payer: Self-pay

## 2022-11-24 DIAGNOSIS — E119 Type 2 diabetes mellitus without complications: Secondary | ICD-10-CM

## 2022-11-24 MED ORDER — FREESTYLE FREEDOM LITE W/DEVICE KIT: 1.0000 | PACK | Freq: Once | 3 refills | Status: AC

## 2022-11-26 ENCOUNTER — Other Ambulatory Visit: Payer: Self-pay | Admitting: Family Medicine

## 2022-11-26 DIAGNOSIS — F339 Major depressive disorder, recurrent, unspecified: Secondary | ICD-10-CM

## 2022-11-29 LAB — HM DIABETES EYE EXAM

## 2022-12-05 ENCOUNTER — Other Ambulatory Visit: Payer: Self-pay | Admitting: Family Medicine

## 2022-12-05 DIAGNOSIS — E119 Type 2 diabetes mellitus without complications: Secondary | ICD-10-CM

## 2022-12-20 ENCOUNTER — Ambulatory Visit (INDEPENDENT_AMBULATORY_CARE_PROVIDER_SITE_OTHER): Payer: BC Managed Care – PPO | Admitting: Family Medicine

## 2022-12-20 ENCOUNTER — Other Ambulatory Visit (HOSPITAL_COMMUNITY)
Admission: RE | Admit: 2022-12-20 | Discharge: 2022-12-20 | Disposition: A | Discharge status: Home / Self Care | Payer: BC Managed Care – PPO | Source: Ambulatory Visit | Attending: Family Medicine | Admitting: Family Medicine

## 2022-12-20 ENCOUNTER — Encounter: Payer: Self-pay | Admitting: Family Medicine

## 2022-12-20 VITALS — BP 117/79 | HR 79 | Ht 63.0 in | Wt 210.0 lb

## 2022-12-20 DIAGNOSIS — R6889 Other general symptoms and signs: Secondary | ICD-10-CM

## 2022-12-20 DIAGNOSIS — Z124 Encounter for screening for malignant neoplasm of cervix: Secondary | ICD-10-CM | POA: Diagnosis not present

## 2022-12-20 DIAGNOSIS — Z1231 Encounter for screening mammogram for malignant neoplasm of breast: Secondary | ICD-10-CM | POA: Diagnosis not present

## 2022-12-20 DIAGNOSIS — F339 Major depressive disorder, recurrent, unspecified: Secondary | ICD-10-CM

## 2022-12-20 NOTE — Assessment & Plan Note (Signed)
Pap smear done, Patient tolerated procedure well. Informed patient I will keep them updated on results. Right and left Breasts appear normal, no suspicious masses, no skin or nipple changes or axillary nodes.

## 2022-12-20 NOTE — Progress Notes (Signed)
Patient Office Visit   Subjective   Patient ID: Vanessa Shelton, female    DOB: 1976-02-26  Age: 47 y.o. MRN: [id number]  CC:  Chief Complaint  Patient presents with   Anxiety    Patient states she is feeling okay with the buproprion. But complains of brain fog and forgetfullness at work.    Depression   Annual Exam    HPI Vanessa Shelton 47 year old female presents to the clinic for pap smear and anxiety medication side effects. She  has a past medical history of Asthma, Diabetes mellitus without complication, and Tobacco abuse.For the details of today's visit, please refer to assessment and plan.   HPI  .   Outpatient Encounter Medications as of 12/20/2022  Medication Sig   Blood Glucose Monitoring Suppl DEVI 1 each by Does not apply route in the morning, at noon, and at bedtime. May substitute to any manufacturer covered by patient's insurance.   buPROPion (WELLBUTRIN XL) 150 MG 24 hr tablet TAKE 1 TABLET BY MOUTH EVERY DAY   cyclobenzaprine (FLEXERIL) 5 MG tablet Take 1 tablet (5 mg total) by mouth 3 (three) times daily as needed for muscle spasms.   Dulaglutide (TRULICITY) 0.75 MG/0.5ML SOPN Inject 0.75 mg into the skin once a week.   glimepiride (AMARYL) 1 MG tablet TAKE 1 TABLET BY MOUTH DAILY WITH BREAKFAST.   Glucose Blood (BLOOD GLUCOSE TEST STRIPS) STRP 1 each by In Vitro route in the morning, at noon, and at bedtime. May substitute to any manufacturer covered by patient's insurance.   Lancets (ONETOUCH DELICA PLUS LANCET30G) MISC 3 (three) times daily.   metFORMIN (GLUCOPHAGE) 1000 MG tablet Take 1 tablet (1,000 mg total) by mouth 2 (two) times daily with a meal.   Pseudoephedrine HCl (SUDAFED 12 HOUR PO) Take by mouth.   rosuvastatin (CRESTOR) 5 MG tablet Take 1 tablet (5 mg total) by mouth daily.   No facility-administered encounter medications on file as of 12/20/2022.    Past Surgical History:  Procedure Laterality Date   APPENDECTOMY     CHOLECYSTECTOMY      TUBAL LIGATION      Review of Systems  Constitutional:  Negative for chills and fever.  Respiratory:  Negative for shortness of breath.   Cardiovascular:  Negative for chest pain.  Gastrointestinal:  Negative for nausea.  Genitourinary:  Negative for dysuria.  Musculoskeletal:  Negative for back pain.  Neurological:  Negative for dizziness and headaches.  Psychiatric/Behavioral:  The patient does not have insomnia.       Objective    BP 117/79   Pulse 79   Ht 5\' 3"  (1.6 m)   Wt 210 lb (95.3 kg)   SpO2 97%   BMI 37.20 kg/m   Physical Exam Vitals reviewed.  Constitutional:      General: She is not in acute distress.    Appearance: Normal appearance. She is not ill-appearing, toxic-appearing or diaphoretic.  HENT:     Head: Normocephalic.  Eyes:     General:        Right eye: No discharge.        Left eye: No discharge.     Conjunctiva/sclera: Conjunctivae normal.  Cardiovascular:     Rate and Rhythm: Normal rate.     Pulses: Normal pulses.     Heart sounds: Normal heart sounds.  Pulmonary:     Effort: Pulmonary effort is normal. No respiratory distress.     Breath sounds: Normal breath sounds.  Abdominal:     General: Bowel sounds are normal.     Palpations: Abdomen is soft.  Genitourinary:    General: Normal vulva.     Exam position: Lithotomy position.     Pubic Area: No rash.      Labia:        Right: No rash, tenderness, lesion or injury.        Left: No rash, tenderness, lesion or injury.      Urethra: No prolapse or urethral pain.  Musculoskeletal:        General: Normal range of motion.     Cervical back: Normal range of motion.  Skin:    General: Skin is warm and dry.     Capillary Refill: Capillary refill takes less than 2 seconds.  Neurological:     General: No focal deficit present.     Mental Status: She is alert and oriented to person, place, and time.     Coordination: Coordination normal.     Gait: Gait normal.  Psychiatric:        Mood  and Affect: Mood normal.        Behavior: Behavior normal.        Thought Content: Thought content normal.        Judgment: Judgment normal.       Assessment & Plan:  Encounter for Papanicolaou smear of cervix Assessment & Plan: Pap smear done, Patient tolerated procedure well. Informed patient I will keep them updated on results. Right and left Breasts appear normal, no suspicious masses, no skin or nipple changes or axillary nodes.  Orders: -     Cytology - PAP  Forgetfulness -     VITAMIN D 25 Hydroxy (Vit-D Deficiency, Fractures) -     Vitamin B12 -     Folate  Depression, recurrent Assessment & Plan: Patient reported Wellbutrin medication makes her forgetful at work and causes brain fog. Vitamin B12, D and folate labs ordered to rule out deficiency Explained to patient If symptoms persist follow up for medication management.  Discussed about cognitive behavioral therapy focusing on thoughts, belief, and attitudes that affects feelings and behavior, learning about coping skills to deal with certain problems. Maintaining a consistent routine and schedule, Practice stress management and self calming techniques, excersise regularly and spend time outdoors, Do not eat food that are high in fat, added sugar, or salt.    Screening mammogram for breast cancer -     Digital Screening Mammogram, Left and Right; Future    Return in about 2 months (around 02/19/2023), or Anxiety medication side effects, for chronic follow-up, diabetes.   Cruzita Lederer Newman Nip, FNP

## 2022-12-20 NOTE — Patient Instructions (Signed)
It was pleasure meeting with you today. Please take medications as prescribed. Follow up with your primary health provider if any health concerns arises.  

## 2022-12-20 NOTE — Assessment & Plan Note (Addendum)
Patient reported Wellbutrin medication makes her forgetful at work and causes brain fog. Vitamin B12, D and folate labs ordered to rule out deficiency Explained to patient If symptoms persist follow up for medication management.  Discussed about cognitive behavioral therapy focusing on thoughts, belief, and attitudes that affects feelings and behavior, learning about coping skills to deal with certain problems. Maintaining a consistent routine and schedule, Practice stress management and self calming techniques, excersise regularly and spend time outdoors, Do not eat food that are high in fat, added sugar, or salt.

## 2022-12-21 LAB — VITAMIN D 25 HYDROXY (VIT D DEFICIENCY, FRACTURES): Vit D, 25-Hydroxy: 24.2 ng/mL — ABNORMAL LOW (ref 30.0–100.0)

## 2022-12-21 LAB — FOLATE: Folate: 6.5 ng/mL (ref >3.0)

## 2022-12-21 LAB — CYTOLOGY - PAP
Adequacy: ABSENT
Diagnosis: NEGATIVE

## 2022-12-21 LAB — VITAMIN B12: Vitamin B-12: 378 pg/mL (ref 232–1245)

## 2023-01-09 ENCOUNTER — Other Ambulatory Visit: Payer: Self-pay | Admitting: Family Medicine

## 2023-01-26 ENCOUNTER — Other Ambulatory Visit: Payer: Self-pay | Admitting: Nurse Practitioner

## 2023-02-07 ENCOUNTER — Encounter: Payer: Self-pay | Admitting: Family Medicine

## 2023-02-09 ENCOUNTER — Ambulatory Visit (INDEPENDENT_AMBULATORY_CARE_PROVIDER_SITE_OTHER): Payer: BC Managed Care – PPO | Admitting: Family Medicine

## 2023-02-09 ENCOUNTER — Telehealth: Payer: Self-pay | Admitting: Family Medicine

## 2023-02-09 ENCOUNTER — Encounter: Payer: Self-pay | Admitting: Family Medicine

## 2023-02-09 VITALS — BP 127/83 | HR 80 | Ht 63.0 in | Wt 212.0 lb

## 2023-02-09 DIAGNOSIS — F339 Major depressive disorder, recurrent, unspecified: Secondary | ICD-10-CM | POA: Diagnosis not present

## 2023-02-09 DIAGNOSIS — E782 Mixed hyperlipidemia: Secondary | ICD-10-CM

## 2023-02-09 DIAGNOSIS — E119 Type 2 diabetes mellitus without complications: Secondary | ICD-10-CM

## 2023-02-09 DIAGNOSIS — F419 Anxiety disorder, unspecified: Secondary | ICD-10-CM

## 2023-02-09 DIAGNOSIS — F32A Depression, unspecified: Secondary | ICD-10-CM

## 2023-02-09 NOTE — Telephone Encounter (Signed)
Return call

## 2023-02-09 NOTE — Assessment & Plan Note (Addendum)
Flowsheet Row Office Visit from 02/09/2023 in [facility]  PHQ-9 Total Score 17     Increased Wellbutrin 300 mg daily Continued discussion on lifestyle changes, establishing a daily routine, going outdoors, exercise, healthy eating habits, mindfulness and mediatation.  Referral placed to behavioral health services  Patient verbally consented to [facility] services about presenting concerns and psychiatric consultation as appropriate. The services will be billed as appropriate for the patient.

## 2023-02-09 NOTE — Assessment & Plan Note (Addendum)
Labs Ordered today - awaiting results will follow up Last Hemoglobin A1c not at goal 10.6 on 11/04/22 Patient reported taking metformin 1000 mg 2 times daily, Glimepiride 1 mg daily  and Trulicity injection 0.75 weekly  Discussed to  follow a DASH diet which includes vegetables,fruits,whole grains, fat free or low fat diary,fish,poultry,beans,nuts and seeds,vegetable oils. Find an activity that you will enjoy and start to be active at least 5 days a week for 30 minutes each day.  Ophthalmology exam current , Foot exam within desired limits

## 2023-02-09 NOTE — Progress Notes (Signed)
Patient Office Visit   Subjective   Patient ID: Vanessa Shelton, female    DOB: 1976/08/17  Age: 47 y.o. MRN: [id number]  CC:  Chief Complaint  Patient presents with   Diabetes    Patient is here for DM f/u. States the trulicity is too expensive and wants to discuss options.     HPI Vanessa Shelton 47 year, presents to the clinic for type 2 diabetes management. She  has a past medical history of Asthma, Diabetes mellitus without complication (HCC), and Tobacco abuse.For the details of today's visit, please refer to assessment and plan.   HPI    Outpatient Encounter Medications as of 02/09/2023  Medication Sig   Blood Glucose Monitoring Suppl DEVI 1 each by Does not apply route in the morning, at noon, and at bedtime. May substitute to any manufacturer covered by patient's insurance.   cyclobenzaprine (FLEXERIL) 5 MG tablet Take 1 tablet (5 mg total) by mouth 3 (three) times daily as needed for muscle spasms.   Dulaglutide (TRULICITY) 0.75 MG/0.5ML SOPN Inject 0.75 mg into the skin once a week.   glimepiride (AMARYL) 1 MG tablet TAKE 1 TABLET BY MOUTH DAILY WITH BREAKFAST.   Glucose Blood (BLOOD GLUCOSE TEST STRIPS) STRP 1 each by In Vitro route in the morning, at noon, and at bedtime. May substitute to any manufacturer covered by patient's insurance.   Lancets (FREESTYLE) lancets USE TO TEST BLOOD SUGAR, AM, NOON & BEDTIME, 3 TIMES A DAY   metFORMIN (GLUCOPHAGE) 1000 MG tablet Take 1 tablet (1,000 mg total) by mouth 2 (two) times daily with a meal.   Pseudoephedrine HCl (SUDAFED 12 HOUR PO) Take by mouth.   rosuvastatin (CRESTOR) 5 MG tablet Take 1 tablet (5 mg total) by mouth daily.   [DISCONTINUED] buPROPion (WELLBUTRIN XL) 150 MG 24 hr tablet TAKE 1 TABLET BY MOUTH EVERY DAY   No facility-administered encounter medications on file as of 02/09/2023.    Past Surgical History:  Procedure Laterality Date   APPENDECTOMY     CHOLECYSTECTOMY     TUBAL LIGATION      Review of Systems   Constitutional:  Negative for chills and fever.  Eyes:  Negative for blurred vision.  Respiratory:  Negative for shortness of breath.   Cardiovascular:  Negative for chest pain.  Gastrointestinal:  Negative for abdominal pain.  Genitourinary:  Negative for dysuria.  Musculoskeletal:  Negative for myalgias.  Neurological:  Negative for dizziness and headaches.      Objective    BP 127/83   Pulse 80   Ht 5\' 3"  (1.6 m)   Wt 212 lb (96.2 kg)   SpO2 96%   BMI 37.55 kg/m   Physical Exam Vitals reviewed.  Constitutional:      General: She is not in acute distress.    Appearance: Normal appearance. She is not ill-appearing, toxic-appearing or diaphoretic.  HENT:     Head: Normocephalic.  Eyes:     General:        Right eye: No discharge.        Left eye: No discharge.     Conjunctiva/sclera: Conjunctivae normal.  Cardiovascular:     Rate and Rhythm: Normal rate.     Pulses: Normal pulses.     Heart sounds: Normal heart sounds.  Pulmonary:     Effort: Pulmonary effort is normal. No respiratory distress.     Breath sounds: Normal breath sounds.  Abdominal:     General: Bowel sounds are  normal.     Palpations: Abdomen is soft.     Tenderness: There is no abdominal tenderness. There is no guarding.  Musculoskeletal:        General: Normal range of motion.     Cervical back: Normal range of motion.  Skin:    General: Skin is warm and dry.     Capillary Refill: Capillary refill takes less than 2 seconds.  Neurological:     General: No focal deficit present.     Mental Status: She is alert and oriented to person, place, and time.     Coordination: Coordination normal.     Gait: Gait normal.  Psychiatric:        Mood and Affect: Mood normal.        Behavior: Behavior normal.       Assessment & Plan:  Type 2 diabetes mellitus without complication, without long-term current use of insulin (HCC) Assessment & Plan: Labs Ordered today - awaiting results will follow  up Last Hemoglobin A1c not at goal 10.6 on 11/04/22 Patient reported taking metformin 1000 mg 2 times daily, Glimepiride 1 mg daily  and Trulicity injection 0.75 weekly  Discussed to  follow a DASH diet which includes vegetables,fruits,whole grains, fat free or low fat diary,fish,poultry,beans,nuts and seeds,vegetable oils. Find an activity that you will enjoy and start to be active at least 5 days a week for 30 minutes each day.  Ophthalmology exam current , Foot exam within desired limits   Orders: -     Hemoglobin A1c  Mixed hyperlipidemia -     Lipid panel -     CMP14+EGFR -     CBC with Differential/Platelet  Anxiety and depression -     Ambulatory referral to Behavioral Health  Depression, recurrent [facility]) Assessment & Plan: Flowsheet Row Office Visit from 02/09/2023 in [facility]  PHQ-9 Total Score 17     Increased Wellbutrin 300 mg daily Continued discussion on lifestyle changes, establishing a daily routine, going outdoors, exercise, healthy eating habits, mindfulness and mediatation.  Referral placed to behavioral health services  Patient verbally consented to [facility] services about presenting concerns and psychiatric consultation as appropriate. The services will be billed as appropriate for the patient.       Return in about 3 months (around 05/12/2023) for chronic follow-up, diabetes.   Cruzita Lederer Newman Nip, FNP

## 2023-02-09 NOTE — Patient Instructions (Signed)

## 2023-02-10 ENCOUNTER — Other Ambulatory Visit: Payer: Self-pay | Admitting: Family Medicine

## 2023-02-10 DIAGNOSIS — E119 Type 2 diabetes mellitus without complications: Secondary | ICD-10-CM

## 2023-02-10 LAB — CMP14+EGFR
ALT: 27 IU/L (ref 0–32)
AST: 25 IU/L (ref 0–40)
Albumin/Globulin Ratio: 1.8 (ref 1.2–2.2)
Albumin: 4.6 g/dL (ref 3.9–4.9)
Alkaline Phosphatase: 95 IU/L (ref 44–121)
BUN/Creatinine Ratio: 15 (ref 9–23)
BUN: 11 mg/dL (ref 6–24)
Bilirubin Total: 0.3 mg/dL (ref 0.0–1.2)
CO2: 21 mmol/L (ref 20–29)
Calcium: 10 mg/dL (ref 8.7–10.2)
Chloride: 106 mmol/L (ref 96–106)
Creatinine, Ser: 0.72 mg/dL (ref 0.57–1.00)
Globulin, Total: 2.6 g/dL (ref 1.5–4.5)
Glucose: 101 mg/dL — ABNORMAL HIGH (ref 70–99)
Potassium: 4.9 mmol/L (ref 3.5–5.2)
Sodium: 141 mmol/L (ref 134–144)
Total Protein: 7.2 g/dL (ref 6.0–8.5)
eGFR: 106 mL/min/{1.73_m2} (ref >59)

## 2023-02-10 LAB — CBC WITH DIFFERENTIAL/PLATELET
Basophils Absolute: 0 10*3/uL (ref 0.0–0.2)
Basos: 0 %
EOS (ABSOLUTE): 1.7 10*3/uL — ABNORMAL HIGH (ref 0.0–0.4)
Eos: 15 %
Hematocrit: 42.8 % (ref 34.0–46.6)
Hemoglobin: 14.2 g/dL (ref 11.1–15.9)
Immature Grans (Abs): 0.1 10*3/uL (ref 0.0–0.1)
Immature Granulocytes: 1 %
Lymphocytes Absolute: 2.5 10*3/uL (ref 0.7–3.1)
Lymphs: 23 %
MCH: 31.6 pg (ref 26.6–33.0)
MCHC: 33.2 g/dL (ref 31.5–35.7)
MCV: 95 fL (ref 79–97)
Monocytes Absolute: 0.6 10*3/uL (ref 0.1–0.9)
Monocytes: 5 %
Neutrophils Absolute: 6.3 10*3/uL (ref 1.4–7.0)
Neutrophils: 56 %
Platelets: 375 10*3/uL (ref 150–450)
RBC: 4.49 x10E6/uL (ref 3.77–5.28)
RDW: 11.8 % (ref 11.7–15.4)
WBC: 11.1 10*3/uL — ABNORMAL HIGH (ref 3.4–10.8)

## 2023-02-10 LAB — LIPID PANEL
Chol/HDL Ratio: 3.7 ratio (ref 0.0–4.4)
Cholesterol, Total: 143 mg/dL (ref 100–199)
HDL: 39 mg/dL — ABNORMAL LOW (ref >39)
LDL Chol Calc (NIH): 87 mg/dL (ref 0–99)
Triglycerides: 91 mg/dL (ref 0–149)
VLDL Cholesterol Cal: 17 mg/dL (ref 5–40)

## 2023-02-10 LAB — HEMOGLOBIN A1C
Est. average glucose Bld gHb Est-mCnc: 126 mg/dL
Hgb A1c MFr Bld: 6 % — ABNORMAL HIGH (ref 4.8–5.6)

## 2023-02-10 MED ORDER — PHENTERMINE HCL 30 MG PO CAPS: 30.0000 mg | ORAL_CAPSULE | ORAL | 0 refills | Status: DC

## 2023-02-10 MED ORDER — METFORMIN HCL 500 MG PO TABS
500.0000 mg | ORAL_TABLET | Freq: Every day | ORAL | 3 refills | Status: DC
Start: 2023-02-10 — End: 2024-05-14

## 2023-02-18 ENCOUNTER — Ambulatory Visit: Payer: BC Managed Care – PPO | Admitting: Family Medicine

## 2023-02-19 ENCOUNTER — Encounter: Payer: Self-pay | Admitting: Family Medicine

## 2023-02-21 ENCOUNTER — Other Ambulatory Visit: Payer: Self-pay | Admitting: Family Medicine

## 2023-02-21 DIAGNOSIS — F32A Depression, unspecified: Secondary | ICD-10-CM

## 2023-02-21 MED ORDER — BUPROPION HCL ER (XL) 300 MG PO TB24
300.0000 mg | ORAL_TABLET | Freq: Every day | ORAL | 1 refills | Status: DC
Start: 2023-02-21 — End: 2023-10-03

## 2023-03-13 ENCOUNTER — Other Ambulatory Visit: Payer: Self-pay | Admitting: Family Medicine

## 2023-03-14 ENCOUNTER — Other Ambulatory Visit: Payer: Self-pay | Admitting: Family Medicine

## 2023-03-14 MED ORDER — PHENTERMINE HCL 37.5 MG PO CAPS: 37.5000 mg | ORAL_CAPSULE | ORAL | 2 refills | Status: DC

## 2023-03-14 NOTE — Telephone Encounter (Signed)
sent 

## 2023-03-31 ENCOUNTER — Ambulatory Visit: Payer: BC Managed Care – PPO | Admitting: Professional Counselor

## 2023-03-31 DIAGNOSIS — F419 Anxiety disorder, unspecified: Secondary | ICD-10-CM
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
## 2023-03-31 NOTE — Patient Instructions (Addendum)
[facility] provided mindfulness techniques to practice along with sleep hygiene and cbt education   If your symptoms worsen or you have thoughts of suicide/homicide, PLEASE SEEK IMMEDIATE MEDICAL ATTENTION.  You may always call:   National Suicide Hotline: 988 or (925) 204-4673 [place] Crisis Line: 347 162 7031 Crisis Recovery in [place]: (323)005-7384     These are available 24 hours a day, 7 days a week.

## 2023-04-04 ENCOUNTER — Telehealth: Payer: BC Managed Care – PPO | Admitting: Physician Assistant

## 2023-04-04 DIAGNOSIS — B029 Zoster without complications: Secondary | ICD-10-CM | POA: Diagnosis not present

## 2023-04-04 MED ORDER — VALACYCLOVIR HCL 1 G PO TABS
1000.0000 mg | ORAL_TABLET | Freq: Three times a day (TID) | ORAL | 0 refills | Status: AC
Start: 2023-04-04 — End: 2023-04-11

## 2023-04-04 NOTE — Progress Notes (Signed)
I have spent 5 minutes in review of e-visit questionnaire, review and updating patient chart, medical decision making and response to patient.   William Cody Martin, PA-C    

## 2023-04-04 NOTE — Progress Notes (Signed)
E-visit for Shingles   We are sorry that you are not feeling well. Here is how we plan to help!  Based on what you shared with me it looks like you have shingles.  Shingles or herpes zoster, is a common infection of the nerves.  It is a painful rash caused by the herpes zoster virus.  This is the same virus that causes chickenpox.  After a person has chickenpox, the virus remains inactive in the nerve cells.  Years later, the virus can become active again and travel to the skin.  It typically will appear on one side of the face or body.  Burning or shooting pain, tingling, or itching are early signs of the infection.  Blisters typically scab over in 7 to 10 days and clear up within 2-4 weeks. Shingles is only contagious to people that have never had the chickenpox, the chickenpox vaccine, or anyone who has a compromised immune system.  You should avoid contact with these type of people until your blisters scab over.  I have prescribed Valacyclovir 1g three times daily for 7 days   HOME CARE: Apply ice packs (wrapped in a thin towel), cool compresses, or soak in cool bath to help reduce pain. Use calamine lotion to calm itchy skin. Avoid scratching the rash. Avoid direct sunlight.  GET HELP RIGHT AWAY IF: Symptoms that don't away after treatment. A rash or blisters near your eye. Increased drainage, fever, or rash after treatment. Severe pain that doesn't go away.   MAKE SURE YOU   Understand these instructions. Will watch your condition. Will get help right away if you are not doing well or get worse.  Thank you for choosing an e-visit.  Your e-visit answers were reviewed by a board certified advanced clinical practitioner to complete your personal care plan. Depending upon the condition, your plan could have included both over the counter or prescription medications.  Please review your pharmacy choice. Make sure the pharmacy is open so you can pick up prescription now. If there is  a problem, you may contact your provider through MyChart messaging and have the prescription routed to another pharmacy.  Your safety is important to us. If you have drug allergies check your prescription carefully.   For the next 24 hours you can use MyChart to ask questions about today's visit, request a non-urgent call back, or ask for a work or school excuse. You will get an email in the next two days asking about your experience. I hope that your e-visit has been valuable and will speed your recovery.  

## 2023-04-07 ENCOUNTER — Ambulatory Visit (INDEPENDENT_AMBULATORY_CARE_PROVIDER_SITE_OTHER): Payer: BC Managed Care – PPO | Admitting: Professional Counselor

## 2023-04-07 DIAGNOSIS — F411 Generalized anxiety disorder: Secondary | ICD-10-CM
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
## 2023-04-13 ENCOUNTER — Telehealth (INDEPENDENT_AMBULATORY_CARE_PROVIDER_SITE_OTHER): Payer: BC Managed Care – PPO | Admitting: Professional Counselor

## 2023-04-13 DIAGNOSIS — F411 Generalized anxiety disorder: Secondary | ICD-10-CM

## 2023-04-13 NOTE — BH Specialist Note (Signed)
Behavioral Health Treatment Plan Team Note  MRN: [id number] NAME: Vanessa Shelton  DATE: 04/15/23  Start time: Start Time: 0350 End time: Stop Time: 0400 Total time: Total Time in Minutes (Visit): 10 Documentation Time: 20 min Total collaborative care time: 30 min  Total number of Virtual BH Treatment Team Plan encounters: 1/4  Treatment Team Attendees: Dr. Vanetta Shawl and Esmond Harps   Collaborative Care Psychiatric Consultant Case Review    Assessment/Provisional Diagnosis Vanessa Shelton is a 47 y.o. year old female with history of Depression, anxiety, type II diabetes, hyperlipidemia.. The patient is referred for depression.    Bupropion was uptitrated to 300 mg in June .   # MDD, recurrent, moderate The patient experiences depressive symptoms and anxiety in the context of the following stressors. BH specialist had an opportunity to see the patient again and noted improvement in her mood and communication with her husband. It is recommended to continue the current medication regimen. BH specialist will continue working on CBT and coaching communication skills to improve her relationship with her significant other. Although the patient scored positive for suicidal thoughts, she denies these with the behavioral health specialist and demonstrates a future-oriented thought process and a therapeutic relationship. The risk is deemed not imminent and is appropriate for outpatient follow-up.   Recommendation Continue bupropion 300 mg daily BH specialist to follow up every other week.  Goals, Interventions and Follow-up Plan Goals: Increase healthy adjustment to current life circumstances Interventions: Mindfulness or Relaxation Training CBT Cognitive Behavioral Therapy Medication Management Recommendations: Continue bupropion 300mg  daily Follow-up Plan:  Bi-weekly CBT counseling  History of the present illness Presenting Problem/Current Symptoms:  The patient is a 47 year old female with a  history of anxiety and depression, reporting increased anxiety over the past three years. She describes symptoms such as excessive worrying, a disrupted sleep schedule, and feelings of insecurity and fear surrounding her relationship. Her anxiety is largely rooted in an event three years ago when her husband cheated, which has led her to fear he might cheat again. She often feels like her husband is pulling away, which exacerbates her suspicions and general anxiety, impacting her daily life. Although she continues to work every day, her anxiety affects her ability to concentrate, makes her feel tense, reduces her patience with clients, and generally distracts her. She wakes up several times at night and has trouble falling asleep due to an overactive mind.   The patient also has a history of depression from a divorce in 2001 and experienced anxiety during her teenage years, influenced by witnessing her parents' frequent fights and lack of affection. These early experiences contribute to her current feelings of insecurity and need for affection in her relationships. Despite having a supportive system and believing her husband has been good and loyal in recent years, she struggles to move past the affair and is constantly worried it will happen again. She is seeking ways to reduce her anxiety and improve her relationship with her husband.   The patient is currently taking Wellbutrin at 300 milligrams, which was recently increased over a month ago, but she feels it may not be as effective as she had hoped. She has no psychiatric history, no past suicide attempts, and denies any current suicidality or thoughts of self-harm.   Screenings PHQ-9 Assessments:     04/07/2023    2:34 PM 03/31/2023    1:21 PM 02/09/2023    8:09 AM  Depression screen PHQ 2/9  Decreased Interest 1 1 2  Down, Depressed, Hopeless 1 2 3   PHQ - 2 Score 2 3 5   Altered sleeping 3 2 1   Tired, decreased energy 2 3 2   Change in appetite 3  2 2   Feeling bad or failure about yourself  1 3 3   Trouble concentrating 0 0 1  Moving slowly or fidgety/restless 0 0 0  Suicidal thoughts 1 3 3   PHQ-9 Score 12 16 17   Difficult doing work/chores   Somewhat difficult   GAD-7 Assessments:     03/31/2023    1:34 PM 02/09/2023    8:09 AM 12/20/2022    8:08 AM 11/18/2022    8:09 AM  GAD 7 : Generalized Anxiety Score  Nervous, Anxious, on Edge 3 3 3 1   Control/stop worrying 3 3 2 2   Worry too much - different things 3 3 2 2   Trouble relaxing 3 3 2 1   Restless 2 3 1  0  Easily annoyed or irritable 3 3 3 2   Afraid - awful might happen 2 3 2 2   Total GAD 7 Score 19 21 15 10   Anxiety Difficulty Somewhat difficult Very difficult Very difficult Not difficult at all    Past Medical History Past Medical History:  Diagnosis Date   Asthma    Diabetes mellitus without complication (HCC)    Tobacco abuse     Vital signs: There were no vitals filed for this visit.  Allergies:  Allergies as of 04/13/2023 - Review Complete 02/09/2023  Allergen Reaction Noted   Penicillins Hives 09/07/2012   Bee venom Swelling 09/07/2012   Latex Itching and Rash 09/07/2012    Medication History Current medications:  Outpatient Encounter Medications as of 04/13/2023  Medication Sig   Blood Glucose Monitoring Suppl DEVI 1 each by Does not apply route in the morning, at noon, and at bedtime. May substitute to any manufacturer covered by patient's insurance.   buPROPion (WELLBUTRIN XL) 300 MG 24 hr tablet Take 1 tablet (300 mg total) by mouth daily.   cyclobenzaprine (FLEXERIL) 5 MG tablet Take 1 tablet (5 mg total) by mouth 3 (three) times daily as needed for muscle spasms.   glimepiride (AMARYL) 1 MG tablet TAKE 1 TABLET BY MOUTH DAILY WITH BREAKFAST.   Lancets (FREESTYLE) lancets USE TO TEST BLOOD SUGAR, AM, NOON & BEDTIME, 3 TIMES A DAY   metFORMIN (GLUCOPHAGE) 500 MG tablet Take 1 tablet (500 mg total) by mouth daily with breakfast.   phentermine 37.5 MG  capsule Take 1 capsule (37.5 mg total) by mouth every morning.   Pseudoephedrine HCl (SUDAFED 12 HOUR PO) Take by mouth.   rosuvastatin (CRESTOR) 5 MG tablet Take 1 tablet (5 mg total) by mouth daily.   No facility-administered encounter medications on file as of 04/13/2023.     Scribe for Treatment Team: Reuel Boom

## 2023-04-22 ENCOUNTER — Ambulatory Visit (INDEPENDENT_AMBULATORY_CARE_PROVIDER_SITE_OTHER): Payer: BC Managed Care – PPO | Admitting: Professional Counselor

## 2023-04-22 DIAGNOSIS — F411 Generalized anxiety disorder: Secondary | ICD-10-CM

## 2023-04-22 NOTE — BH Specialist Note (Signed)
[facility] Telephone Follow-up  MRN: [id number] NAME: Vanessa Shelton Date: 04/22/23  Start time: Start Time: 0230 End time: Stop Time: 0300 Total time: Total Time in Minutes (Visit): 30 Call number: Visit Number: 4- Fourth Visit  Reason for visit today:  The patient is a 47 year old female who presented for a collaborative care follow-up, reporting increased anxiety due to her daughter being hospitalized. She expressed significant worry about her daughter's health, which has led to additional responsibilities, including taking care of her grandchildren. This, combined with a demanding workload and decreased sleep, has left the patient feeling overwhelmed and stressed. She reports working extensive overtime, which is contributing to feelings of burnout, and she is often exhausted by the end of the day. The patient is also experiencing difficulty falling asleep and typically gets only about four hours of sleep per night, further exacerbating her stress.  The patient noted that she had been feeling somewhat better recently but has had a particularly rough week. She is considering the need for more effective anxiety management. Currently, she is on Wellbutrin 300 mg, which she feels has been helpful, but she is open to increasing the dosage or possibly switching to a different medication that might be more effective. She is also interested in exploring options for as-needed medication for anxiety and sleep, mentioning Hydroxyzine as a potential option.  The behavioral counselor followed up with her primary care provider to discuss whether Hydroxyzine could be an appropriate addition to her treatment plan. Additionally, the session included implementing mindfulness and behavioral activation strategies. The patient was encouraged to increase her physical activity and to regularly practice deep breathing techniques and general mindfulness to help manage her anxiety. A follow-up appointment is scheduled for  two weeks to reassess her progress and explore further treatment options if necessary.   PHQ-9 Scores:     04/22/2023    2:32 PM 04/07/2023    2:34 PM 03/31/2023    1:21 PM 02/09/2023    8:09 AM 12/20/2022    8:08 AM  Depression screen PHQ 2/9  Decreased Interest 1 1 1 2 1   Down, Depressed, Hopeless 1 1 2 3 1   PHQ - 2 Score 2 2 3 5 2   Altered sleeping 3 3 2 1 1   Tired, decreased energy 2 2 3 2 1   Change in appetite 2 3 2 2 2   Feeling bad or failure about yourself  1 1 3 3 1   Trouble concentrating 0 0 0 1 0  Moving slowly or fidgety/restless 0 0 0 0 0  Suicidal thoughts 0 1 3 3 1   PHQ-9 Score 10 12 16 17 8   Difficult doing work/chores    Somewhat difficult Somewhat difficult   GAD-7 Scores:     04/22/2023    2:33 PM 03/31/2023    1:34 PM 02/09/2023    8:09 AM 12/20/2022    8:08 AM  GAD 7 : Generalized Anxiety Score  Nervous, Anxious, on Edge 3 3 3 3   Control/stop worrying 3 3 3 2   Worry too much - different things 3 3 3 2   Trouble relaxing 3 3 3 2   Restless 3 2 3 1   Easily annoyed or irritable 3 3 3 3   Afraid - awful might happen 3 2 3 2   Total GAD 7 Score 21 19 21 15   Anxiety Difficulty Somewhat difficult Somewhat difficult Very difficult Very difficult    Stress Current stressors:  Daughter is in hospital Sleep:  4hrs a night Appetite:  up and down Coping ability:  Fair Patient taking medications as prescribed:  Yes  Current medications:  Outpatient Encounter Medications as of 04/22/2023  Medication Sig   Blood Glucose Monitoring Suppl DEVI 1 each by Does not apply route in the morning, at noon, and at bedtime. May substitute to any manufacturer covered by patient's insurance.   buPROPion (WELLBUTRIN XL) 300 MG 24 hr tablet Take 1 tablet (300 mg total) by mouth daily.   cyclobenzaprine (FLEXERIL) 5 MG tablet Take 1 tablet (5 mg total) by mouth 3 (three) times daily as needed for muscle spasms.   glimepiride (AMARYL) 1 MG tablet TAKE 1 TABLET BY MOUTH DAILY WITH BREAKFAST.    Lancets (FREESTYLE) lancets USE TO TEST BLOOD SUGAR, AM, NOON & BEDTIME, 3 TIMES A DAY   metFORMIN (GLUCOPHAGE) 500 MG tablet Take 1 tablet (500 mg total) by mouth daily with breakfast.   phentermine 37.5 MG capsule Take 1 capsule (37.5 mg total) by mouth every morning.   Pseudoephedrine HCl (SUDAFED 12 HOUR PO) Take by mouth.   rosuvastatin (CRESTOR) 5 MG tablet Take 1 tablet (5 mg total) by mouth daily.   No facility-administered encounter medications on file as of 04/22/2023.     Self-harm Behaviors Risk Assessment Self-harm risk factors:  Low Patient endorses recent thoughts of harming self:  Denies   Danger to Others Risk Assessment Danger to others risk factors:  None  Patient endorses recent thoughts of harming others:  Denies    Substance Use Assessment Patient recently consumed alcohol:  None  Alcohol Use Disorder Identification Test (AUDIT):     12/18/2022   12:36 PM  Alcohol Use Disorder Test (AUDIT)  1. How often do you have a drink containing alcohol? 0  3. How often do you have six or more drinks on one occasion? 0   Goals, Interventions and Follow-up Plan Goals:  Decrease anxiety and improve quality of life.  Interventions: Mindfulness or Relaxation Training and CBT Cognitive Behavioral Therapy Follow-up Plan:  Bi-weekly cbt counseling   Reuel Boom

## 2023-04-24 ENCOUNTER — Other Ambulatory Visit: Payer: Self-pay | Admitting: Family Medicine

## 2023-04-24 MED ORDER — PAROXETINE HCL 10 MG PO TABS: 10.0000 mg | ORAL_TABLET | Freq: Every day | ORAL | 3 refills | Status: DC

## 2023-04-24 MED ORDER — HYDROXYZINE PAMOATE 25 MG PO CAPS: 25.0000 mg | ORAL_CAPSULE | Freq: Three times a day (TID) | ORAL | 3 refills | Status: AC | PRN

## 2023-05-10 NOTE — Patient Instructions (Signed)

## 2023-05-10 NOTE — Progress Notes (Unsigned)
   Patient Office Visit   Subjective   Patient ID: Vanessa Shelton, female    DOB: May 27, 1976  Age: 47 y.o. MRN: [id number]  CC: No chief complaint on file.   HPI Vanessa Shelton 47 year old female, presents to the clinic for,  She  has a past medical history of Asthma, Diabetes mellitus without complication (HCC), and Tobacco abuse.  HPI    Outpatient Encounter Medications as of 05/12/2023  Medication Sig   Blood Glucose Monitoring Suppl DEVI 1 each by Does not apply route in the morning, at noon, and at bedtime. May substitute to any manufacturer covered by patient's insurance.   buPROPion (WELLBUTRIN XL) 300 MG 24 hr tablet Take 1 tablet (300 mg total) by mouth daily.   cyclobenzaprine (FLEXERIL) 5 MG tablet Take 1 tablet (5 mg total) by mouth 3 (three) times daily as needed for muscle spasms.   glimepiride (AMARYL) 1 MG tablet TAKE 1 TABLET BY MOUTH DAILY WITH BREAKFAST.   hydrOXYzine (VISTARIL) 25 MG capsule Take 1 capsule (25 mg total) by mouth every 8 (eight) hours as needed.   Lancets (FREESTYLE) lancets USE TO TEST BLOOD SUGAR, AM, NOON & BEDTIME, 3 TIMES A DAY   metFORMIN (GLUCOPHAGE) 500 MG tablet Take 1 tablet (500 mg total) by mouth daily with breakfast.   PARoxetine (PAXIL) 10 MG tablet Take 1 tablet (10 mg total) by mouth at bedtime.   phentermine 37.5 MG capsule Take 1 capsule (37.5 mg total) by mouth every morning.   Pseudoephedrine HCl (SUDAFED 12 HOUR PO) Take by mouth.   rosuvastatin (CRESTOR) 5 MG tablet Take 1 tablet (5 mg total) by mouth daily.   No facility-administered encounter medications on file as of 05/12/2023.    Past Surgical History:  Procedure Laterality Date   APPENDECTOMY     CHOLECYSTECTOMY     TUBAL LIGATION      ROS    Objective    There were no vitals taken for this visit.  Physical Exam    Assessment & Plan:  There are no diagnoses linked to this encounter.  No follow-ups on file.   Cruzita Lederer Newman Nip, FNP

## 2023-05-12 ENCOUNTER — Ambulatory Visit: Payer: BC Managed Care – PPO | Admitting: Professional Counselor

## 2023-05-12 ENCOUNTER — Encounter: Payer: Self-pay | Admitting: Family Medicine

## 2023-05-12 ENCOUNTER — Ambulatory Visit (INDEPENDENT_AMBULATORY_CARE_PROVIDER_SITE_OTHER): Payer: BC Managed Care – PPO | Admitting: Family Medicine

## 2023-05-12 VITALS — BP 123/81 | HR 76 | Ht 63.0 in | Wt 213.1 lb

## 2023-05-12 DIAGNOSIS — E782 Mixed hyperlipidemia: Secondary | ICD-10-CM | POA: Diagnosis not present

## 2023-05-12 DIAGNOSIS — Z7984 Long term (current) use of oral hypoglycemic drugs: Secondary | ICD-10-CM

## 2023-05-12 DIAGNOSIS — F339 Major depressive disorder, recurrent, unspecified: Secondary | ICD-10-CM

## 2023-05-12 DIAGNOSIS — E119 Type 2 diabetes mellitus without complications: Secondary | ICD-10-CM | POA: Diagnosis not present

## 2023-05-12 NOTE — Assessment & Plan Note (Signed)
Last hemoglobin A1c 6.0, Labs ordered today awaiting results will follow up. Patient reports taking Metformin 500 daily and glimepiride 1 mg daily.Discussed Non pharmacological interventions such as low carb diet,high in protein, vegetables and fruit discussed. Educated on importance of physical activity 150 minutes per week. Discussed signs and symptoms of hypoglycemia, & hyperglycemia and need to present to the ED if symptoms occurs.Follow up in 3 months or sooner if needed. Patient verbalizes understanding regarding plan of care and all questions answered. Ophthalmology current , Foot exam within desired limits

## 2023-05-12 NOTE — BH Specialist Note (Signed)
[facility] Telephone Follow-up  MRN: [id number] NAME: Vanessa Shelton Date: 05/12/23  Start time: Start Time: 1100 End time: Stop Time: 1130 Total time: Total Time in Minutes (Visit): 30 Call number: Visit Number: 5-Fifth Visit  Reason for call today:  The patient is a 47 year old female returning for a collaborative care follow-up. She reports significant improvement in her symptoms, particularly with better sleep since starting Paxil, which she says has also helped reduce her anxiety and overall stress. She feels that her lack of sleep was a major contributor to her symptoms, and now that it has improved, she is feeling better overall.  However, she continues to experience minor communication breakdowns with her husband and the ongoing stress of her work schedule and workload. She is learning to manage these stressors using mindfulness and CBT techniques. Recently, she had heightened anxiety due to a medical scare involving her daughter, which was diagnosed as a staph infection, but her anxiety has since decreased as her daughter's condition improved. Overall, the patient reports an improved mood and much less anxiety, as reflected in her GAD score, which has dropped from 21 to 8. Although her PHQ-9 scores for depression have remained steady, she notes an overall improvement in her mood. A follow-up appointment is scheduled for one month.  PHQ-9 Scores:     05/12/2023    8:08 AM 04/22/2023    2:32 PM 04/07/2023    2:34 PM 03/31/2023    1:21 PM 02/09/2023    8:09 AM  Depression screen PHQ 2/9  Decreased Interest 1 1 1 1 2   Down, Depressed, Hopeless 1 1 1 2 3   PHQ - 2 Score 2 2 2 3 5   Altered sleeping 1 3 3 2 1   Tired, decreased energy 2 2 2 3 2   Change in appetite 2 2 3 2 2   Feeling bad or failure about yourself  3 1 1 3 3   Trouble concentrating 0 0 0 0 1  Moving slowly or fidgety/restless 0 0 0 0 0  Suicidal thoughts 1 0 1 3 3   PHQ-9 Score 11 10 12 16 17   Difficult doing  work/chores     Somewhat difficult   GAD-7 Scores:     05/12/2023    8:08 AM 04/22/2023    2:33 PM 03/31/2023    1:34 PM 02/09/2023    8:09 AM  GAD 7 : Generalized Anxiety Score  Nervous, Anxious, on Edge 1 3 3 3   Control/stop worrying 1 3 3 3   Worry too much - different things 1 3 3 3   Trouble relaxing 1 3 3 3   Restless 0 3 2 3   Easily annoyed or irritable 2 3 3 3   Afraid - awful might happen 2 3 2 3   Total GAD 7 Score 8 21 19 21   Anxiety Difficulty  Somewhat difficult Somewhat difficult Very difficult    Stress Current stressors:  Financial stress Sleep:  Improved sleep Appetite:  fair Coping ability:  fair Patient taking medications as prescribed:  Yes  Current medications:  Outpatient Encounter Medications as of 05/12/2023  Medication Sig   Blood Glucose Monitoring Suppl DEVI 1 each by Does not apply route in the morning, at noon, and at bedtime. May substitute to any manufacturer covered by patient's insurance.   buPROPion (WELLBUTRIN XL) 300 MG 24 hr tablet Take 1 tablet (300 mg total) by mouth daily.   cyclobenzaprine (FLEXERIL) 5 MG tablet Take 1 tablet (5 mg total) by mouth 3 (three)  times daily as needed for muscle spasms.   glimepiride (AMARYL) 1 MG tablet TAKE 1 TABLET BY MOUTH DAILY WITH BREAKFAST.   hydrOXYzine (VISTARIL) 25 MG capsule Take 1 capsule (25 mg total) by mouth every 8 (eight) hours as needed.   Lancets (FREESTYLE) lancets USE TO TEST BLOOD SUGAR, AM, NOON & BEDTIME, 3 TIMES A DAY   metFORMIN (GLUCOPHAGE) 500 MG tablet Take 1 tablet (500 mg total) by mouth daily with breakfast.   PARoxetine (PAXIL) 10 MG tablet Take 1 tablet (10 mg total) by mouth at bedtime.   Pseudoephedrine HCl (SUDAFED 12 HOUR PO) Take by mouth.   rosuvastatin (CRESTOR) 5 MG tablet Take 1 tablet (5 mg total) by mouth daily.   No facility-administered encounter medications on file as of 05/12/2023.     Self-harm Behaviors Risk Assessment Self-harm risk factors:  Denies Patient  endorses recent thoughts of harming self:  Denies    Danger to Others Risk Assessment Danger to others risk factors:  Denies Patient endorses recent thoughts of harming others:  Denies   Goals, Interventions and Follow-up Plan Goals: Maintain improved mood Interventions: Mindfulness or Relaxation Training Follow-up Plan:     Reuel Boom

## 2023-05-12 NOTE — Assessment & Plan Note (Signed)
Flowsheet Row Office Visit from 05/12/2023 in [facility]  PHQ-9 Total Score 11     Phq-9 scores has improved since our last visit Patient is tolerating well Wellbutrin 300 mg once daily and Paxil 10 mg at bedtime. Continued discussion on lifestyle changes, establishing a daily routine, going outdoors, exercise, healthy eating habits, mindfulness and mediatation.

## 2023-05-13 LAB — BMP8+EGFR
BUN/Creatinine Ratio: 14 (ref 9–23)
BUN: 9 mg/dL (ref 6–24)
CO2: 22 mmol/L (ref 20–29)
Calcium: 9.3 mg/dL (ref 8.7–10.2)
Chloride: 104 mmol/L (ref 96–106)
Creatinine, Ser: 0.63 mg/dL (ref 0.57–1.00)
Glucose: 107 mg/dL — ABNORMAL HIGH (ref 70–99)
Potassium: 4.3 mmol/L (ref 3.5–5.2)
Sodium: 140 mmol/L (ref 134–144)
eGFR: 112 mL/min/{1.73_m2} (ref >59)

## 2023-05-13 LAB — LIPID PANEL
Chol/HDL Ratio: 3 ratio (ref 0.0–4.4)
Cholesterol, Total: 129 mg/dL (ref 100–199)
HDL: 43 mg/dL (ref >39)
LDL Chol Calc (NIH): 69 mg/dL (ref 0–99)
Triglycerides: 87 mg/dL (ref 0–149)
VLDL Cholesterol Cal: 17 mg/dL (ref 5–40)

## 2023-05-13 LAB — HEMOGLOBIN A1C
Est. average glucose Bld gHb Est-mCnc: 128 mg/dL
Hgb A1c MFr Bld: 6.1 % — ABNORMAL HIGH (ref 4.8–5.6)

## 2023-05-15 ENCOUNTER — Encounter: Payer: Self-pay | Admitting: Family Medicine

## 2023-05-17 ENCOUNTER — Other Ambulatory Visit: Payer: Self-pay | Admitting: Family Medicine

## 2023-05-24 ENCOUNTER — Other Ambulatory Visit: Payer: Self-pay | Admitting: Family Medicine

## 2023-05-24 DIAGNOSIS — E669 Obesity, unspecified: Secondary | ICD-10-CM

## 2023-05-26 ENCOUNTER — Encounter: Payer: Self-pay | Admitting: Bariatrics

## 2023-05-26 ENCOUNTER — Ambulatory Visit (INDEPENDENT_AMBULATORY_CARE_PROVIDER_SITE_OTHER): Payer: BC Managed Care – PPO | Admitting: Bariatrics

## 2023-05-26 VITALS — BP 135/83 | HR 95 | Temp 97.8°F | Ht 64.0 in | Wt 213.0 lb

## 2023-05-26 DIAGNOSIS — Z6836 Body mass index (BMI) 36.0-36.9, adult: Secondary | ICD-10-CM | POA: Diagnosis not present

## 2023-05-26 DIAGNOSIS — E119 Type 2 diabetes mellitus without complications: Secondary | ICD-10-CM | POA: Diagnosis not present

## 2023-05-26 DIAGNOSIS — E782 Mixed hyperlipidemia: Secondary | ICD-10-CM

## 2023-05-26 DIAGNOSIS — E669 Obesity, unspecified: Secondary | ICD-10-CM | POA: Diagnosis not present

## 2023-05-26 DIAGNOSIS — E785 Hyperlipidemia, unspecified: Secondary | ICD-10-CM | POA: Diagnosis not present

## 2023-05-26 DIAGNOSIS — Z0289 Encounter for other administrative examinations: Secondary | ICD-10-CM

## 2023-05-26 DIAGNOSIS — Z7984 Long term (current) use of oral hypoglycemic drugs: Secondary | ICD-10-CM

## 2023-05-26 NOTE — Progress Notes (Signed)
Office: [phone]  /  Fax: [phone]   Initial Visit  Vanessa Shelton was seen in clinic today to evaluate for obesity. She is interested in losing weight to improve overall health and reduce the risk of weight related complications. She presents today to review program treatment options, initial physical assessment, and evaluation.     She was referred by: PCP  When asked what else they would like to accomplish? She states: Adopt healthier eating patterns, Improve energy levels and physical activity, Improve existing medical conditions, Reduce number of medications, and Improve quality of life  When asked how has your weight affected you? She states: Contributed to medical problems, Contributed to orthopedic problems or mobility issues, Having fatigue, and Having poor endurance  Some associated conditions: Hyperlipidemia, OSA, Diabetes, and Vitamin D Deficiency  Contributing factors: Family history and Eating patterns  Weight promoting medications identified: None  Current nutrition plan: Low-carb and Other: Keto   Current level of physical activity: None and Walking  Current or previous pharmacotherapy: Phentermine  Response to medication: Lost weight initially but was unable to sustain weight loss   Past medical history includes:   Past Medical History:  Diagnosis Date   Asthma    Diabetes mellitus without complication (HCC)    Tobacco abuse      Objective:   BP 135/83   Pulse 95   Temp 97.8 F (36.6 C)   Ht 5\' 4"  (1.626 m)   Wt 213 lb (96.6 kg)   SpO2 97%   BMI 36.56 kg/m  She was weighed on the bioimpedance scale: Body mass index is 36.56 kg/m.  Peak Weight:245 lbs , Body Fat%:41.7%, Visceral Fat Rating:11, Weight trend over the last 12 months: fluctuating.   General:  Alert, oriented and cooperative. Patient is in no acute distress.  Respiratory: Normal respiratory effort, no problems with respiration noted  Extremities: Normal range of motion.     Mental Status: Normal mood and affect. Normal behavior. Normal judgment and thought content.   DIAGNOSTIC DATA REVIEWED:  BMET    Component Value Date/Time   NA 140 05/12/2023 0839   K 4.3 05/12/2023 0839   CL 104 05/12/2023 0839   CO2 22 05/12/2023 0839   GLUCOSE 107 (H) 05/12/2023 0839   GLUCOSE 88 12/14/2012 1120   BUN 9 05/12/2023 0839   CREATININE 0.63 05/12/2023 0839   CREATININE 0.59 12/14/2012 1120   CALCIUM 9.3 05/12/2023 0839   GFRNONAA 101 09/15/2015 0920   GFRAA 116 09/15/2015 0920   Lab Results  Component Value Date   HGBA1C 6.1 (H) 05/12/2023   HGBA1C 10.6 (H) 11/04/2022   No results found for: "INSULIN" CBC    Component Value Date/Time   WBC 11.1 (H) 02/09/2023 0900   WBC 11.7 (H) 03/12/2010 0126   RBC 4.49 02/09/2023 0900   RBC 3.97 03/12/2010 0126   HGB 14.2 02/09/2023 0900   HCT 42.8 02/09/2023 0900   PLT 375 02/09/2023 0900   MCV 95 02/09/2023 0900   MCH 31.6 02/09/2023 0900   MCH 33.1 03/12/2010 0126   MCHC 33.2 02/09/2023 0900   MCHC 34.5 03/12/2010 0126   RDW 11.8 02/09/2023 0900   Iron/TIBC/Ferritin/ %Sat No results found for: "IRON", "TIBC", "FERRITIN", "IRONPCTSAT" Lipid Panel     Component Value Date/Time   CHOL 129 05/12/2023 0839   TRIG 87 05/12/2023 0839   HDL 43 05/12/2023 0839   CHOLHDL 3.0 05/12/2023 0839   CHOLHDL 4.0 12/14/2012 1120   VLDL 19 12/14/2012 1120  LDLCALC 69 05/12/2023 0839   Hepatic Function Panel     Component Value Date/Time   PROT 7.2 02/09/2023 0900   ALBUMIN 4.6 02/09/2023 0900   AST 25 02/09/2023 0900   ALT 27 02/09/2023 0900   ALKPHOS 95 02/09/2023 0900   BILITOT 0.3 02/09/2023 0900   BILIDIR 0.13 09/15/2015 0920   IBILI 0.6 12/14/2012 1120      Component Value Date/Time   TSH 0.945 11/04/2022 0904     Assessment and Plan:   Type II Diabetes HgbA1c is at goal. Last A1c was 6.1 CBGs: Not checking   unless sick    Episodes of hypoglycemia: no Medication(s): Metformin 500 mg once  daily breakfast,   Lab Results  Component Value Date   HGBA1C 6.1 (H) 05/12/2023   HGBA1C 6.0 (H) 02/09/2023   HGBA1C 10.6 (H) 11/04/2022   Lab Results  Component Value Date   LDLCALC 69 05/12/2023   CREATININE 0.63 05/12/2023    Plan: Continue Metformin 500 mg once daily breakfast, and Amaryl 1mg .  Continue all other medications.  Will keep all carbohydrates low both sweets and starches.  Will continue exercise regimen to 30 to 60 minutes on most days of the week.  Aim for 7 to 9 hours of sleep nightly.  Eat more low glycemic index foods.   Hyperlipidemia LDL is at goal. Medication(s): Crestor Cardiovascular risk factors: diabetes mellitus, dyslipidemia, family history of premature cardiovascular disease, obesity (BMI >= 30 kg/m2), and sedentary lifestyle  Lab Results  Component Value Date   CHOL 129 05/12/2023   HDL 43 05/12/2023   LDLCALC 69 05/12/2023   TRIG 87 05/12/2023   CHOLHDL 3.0 05/12/2023   Lab Results  Component Value Date   ALT 27 02/09/2023   AST 25 02/09/2023   ALKPHOS 95 02/09/2023   BILITOT 0.3 02/09/2023   The ASCVD Risk score (Arnett DK, et al., 2019) failed to calculate for the following reasons:   The valid total cholesterol range is 130 to 320 mg/dL  Plan:  Continue statin.  Will avoid all trans fats.  Will read labels Will minimize saturated fats except the following: low fat meats in moderation, diary, and limited dark chocolate.       Generalized Obesity: Current BMI 36    Obesity Treatment / Action Plan:  Patient will work on garnering support from family and friends to begin weight loss journey. Will work on eliminating or reducing the presence of highly palatable, calorie dense foods in the home. Will complete provided nutritional and psychosocial assessment questionnaire before the next appointment. Will be scheduled for indirect calorimetry to determine resting energy expenditure in a fasting state.  This will allow [place] to  create a reduced calorie, high-protein meal plan to promote loss of fat mass while preserving muscle mass. Counseled on the health benefits of losing 5%-15% of total body weight. Was counseled on nutritional approaches to weight loss and benefits of reducing processed foods and consuming plant-based foods and high quality protein as part of nutritional weight management. Was counseled on pharmacotherapy and role as an adjunct in weight management.   Obesity Education Performed Today:  She was weighed on the bioimpedance scale and results were discussed and documented in the synopsis.  We discussed obesity as a disease and the importance of a more detailed evaluation of all the factors contributing to the disease.  We discussed the importance of long term lifestyle changes which include nutrition, exercise and behavioral modifications as well as the importance  of customizing this to her specific health and social needs.  We discussed the benefits of reaching a healthier weight to alleviate the symptoms of existing conditions and reduce the risks of the biomechanical, metabolic and psychological effects of obesity.  Discussed New Patient/Late Arrival, and Cancellation Policies. Patient voiced understanding and allowed to ask questions.   Ventura Sellers appears to be in the action stage of change and states they are ready to start intensive lifestyle modifications and behavioral modifications.  30 minutes was spent today on this visit including the above counseling, pre-visit chart review, and post-visit documentation.  Reviewed by clinician on day of visit: allergies, medications, problem list, medical history, surgical history, family history, social history, and previous encounter notes.    Oliver Heitzenrater A. Lorretta HarpO.

## 2023-06-09 ENCOUNTER — Ambulatory Visit: Payer: BC Managed Care – PPO | Admitting: Bariatrics

## 2023-06-10 ENCOUNTER — Ambulatory Visit: Payer: BC Managed Care – PPO | Admitting: Professional Counselor

## 2023-06-10 DIAGNOSIS — F411 Generalized anxiety disorder: Secondary | ICD-10-CM

## 2023-06-10 NOTE — BH Specialist Note (Addendum)
[facility] Telephone Follow-up  MRN: [id number] NAME: KENITA BINES Date: 07/13/23  Start time: Start Time: 0200 End time: Stop Time: 0233 Total time: Total Time in Minutes (Visit): 33 Call number: Visit Number: 6-Sixth Visit  Reason for call today:  Patient was seen today to discuss progress and completion for collaborative care. Patient has made steady improvement. We discussed relapse prevention plan as well. We will sign off on this patient.  PHQ-9 Scores:     06/10/2023    2:10 PM 05/12/2023   11:19 AM 05/12/2023    8:08 AM 04/22/2023    2:32 PM 04/07/2023    2:34 PM  Depression screen PHQ 2/9  Decreased Interest 1 1 1 1 1   Down, Depressed, Hopeless 1 1 1 1 1   PHQ - 2 Score 2 2 2 2 2   Altered sleeping 0 0 1 3 3   Tired, decreased energy 1 3 2 2 2   Change in appetite 2 2 2 2 3   Feeling bad or failure about yourself  1 2 3 1 1   Trouble concentrating 0 1 0 0 0  Moving slowly or fidgety/restless 0 0 0 0 0  Suicidal thoughts 0 1 1 0 1  PHQ-9 Score 6 11 11 10 12   Difficult doing work/chores Not difficult at all Not difficult at all      GAD-7 Scores:     06/10/2023    2:12 PM 05/12/2023   11:21 AM 05/12/2023    8:08 AM 04/22/2023    2:33 PM  GAD 7 : Generalized Anxiety Score  Nervous, Anxious, on Edge 2 1 1 3   Control/stop worrying 3 2 1 3   Worry too much - different things 3 2 1 3   Trouble relaxing 2 0 1 3  Restless 1 0 0 3  Easily annoyed or irritable 2 2 2 3   Afraid - awful might happen 3 1 2 3   Total GAD 7 Score 16 8 8 21   Anxiety Difficulty Not difficult at all Not difficult at all  Somewhat difficult    Stress Current stressors:  Car has oil leak Sleep:  Improved  Appetite:  Good Coping ability:  Good Patient taking medications as prescribed:  Yes  Current medications:  Outpatient Encounter Medications as of 06/10/2023  Medication Sig   Blood Glucose Monitoring Suppl DEVI 1 each by Does not apply route in the morning, at noon, and at bedtime. May  substitute to any manufacturer covered by patient's insurance. (Patient not taking: Reported on 06/23/2023)   buPROPion (WELLBUTRIN XL) 300 MG 24 hr tablet Take 1 tablet (300 mg total) by mouth daily.   cyclobenzaprine (FLEXERIL) 5 MG tablet Take 1 tablet (5 mg total) by mouth 3 (three) times daily as needed for muscle spasms.   hydrOXYzine (VISTARIL) 25 MG capsule Take 1 capsule (25 mg total) by mouth every 8 (eight) hours as needed.   Lancets (FREESTYLE) lancets USE TO TEST BLOOD SUGAR, AM, NOON & BEDTIME, 3 TIMES A DAY (Patient not taking: Reported on 06/23/2023)   metFORMIN (GLUCOPHAGE) 500 MG tablet Take 1 tablet (500 mg total) by mouth daily with breakfast.   PARoxetine (PAXIL) 10 MG tablet TAKE 1 TABLET BY MOUTH EVERYDAY AT BEDTIME   Pseudoephedrine HCl (SUDAFED 12 HOUR PO) Take by mouth. (Patient not taking: Reported on 06/23/2023)   rosuvastatin (CRESTOR) 5 MG tablet Take 1 tablet (5 mg total) by mouth daily.   [DISCONTINUED] glimepiride (AMARYL) 1 MG tablet TAKE 1 TABLET BY MOUTH  DAILY WITH BREAKFAST.   No facility-administered encounter medications on file as of 06/10/2023.     Self-harm Behaviors Risk Assessment Self-harm risk factors:  None Patient endorses recent thoughts of harming self:  Denies   Danger to Others Risk Assessment Danger to others risk factors:  None Patient endorses recent thoughts of harming others:  Denies  Goals, Interventions and Follow-up Plan Goals:  Made progress on goals Interventions: Mindfulness or Relaxation Training and CBT Cognitive Behavioral Therapy Follow-up Plan:  One month follow up  Reuel Boom

## 2023-06-23 ENCOUNTER — Encounter: Payer: Self-pay | Admitting: Bariatrics

## 2023-06-23 ENCOUNTER — Ambulatory Visit: Payer: BC Managed Care – PPO | Admitting: Bariatrics

## 2023-06-23 VITALS — BP 129/84 | HR 83 | Temp 98.2°F | Ht 64.0 in | Wt 219.0 lb

## 2023-06-23 DIAGNOSIS — E66812 Obesity, class 2: Secondary | ICD-10-CM

## 2023-06-23 DIAGNOSIS — Z7984 Long term (current) use of oral hypoglycemic drugs: Secondary | ICD-10-CM

## 2023-06-23 DIAGNOSIS — E7849 Other hyperlipidemia: Secondary | ICD-10-CM

## 2023-06-23 DIAGNOSIS — Z1331 Encounter for screening for depression: Secondary | ICD-10-CM | POA: Diagnosis not present

## 2023-06-23 DIAGNOSIS — R0602 Shortness of breath: Secondary | ICD-10-CM | POA: Diagnosis not present

## 2023-06-23 DIAGNOSIS — Z6837 Body mass index (BMI) 37.0-37.9, adult: Secondary | ICD-10-CM

## 2023-06-23 DIAGNOSIS — E559 Vitamin D deficiency, unspecified: Secondary | ICD-10-CM

## 2023-06-23 DIAGNOSIS — E538 Deficiency of other specified B group vitamins: Secondary | ICD-10-CM | POA: Diagnosis not present

## 2023-06-23 DIAGNOSIS — E119 Type 2 diabetes mellitus without complications: Secondary | ICD-10-CM | POA: Diagnosis not present

## 2023-06-23 DIAGNOSIS — E6609 Other obesity due to excess calories: Secondary | ICD-10-CM

## 2023-06-23 DIAGNOSIS — G4739 Other sleep apnea: Secondary | ICD-10-CM

## 2023-06-23 DIAGNOSIS — R5383 Other fatigue: Secondary | ICD-10-CM

## 2023-06-23 DIAGNOSIS — Z0000 Encounter for general adult medical examination without abnormal findings: Secondary | ICD-10-CM | POA: Insufficient documentation

## 2023-06-24 LAB — INSULIN, RANDOM: INSULIN: 23.8 u[IU]/mL (ref 2.6–24.9)

## 2023-06-24 LAB — VITAMIN B12: Vitamin B-12: 498 pg/mL (ref 232–1245)

## 2023-06-24 LAB — VITAMIN D 25 HYDROXY (VIT D DEFICIENCY, FRACTURES): Vit D, 25-Hydroxy: 39 ng/mL (ref 30.0–100.0)

## 2023-06-27 NOTE — Progress Notes (Signed)
Chief Complaint:   OBESITY Vanessa Shelton (MR# [id number]) is a 47 y.o. female who presents for evaluation and treatment of obesity and related comorbidities. Current BMI is Body mass index is 37.59 kg/m. Vanessa Shelton has been struggling with her weight for many years and has been unsuccessful in either losing weight, maintaining weight loss, or reaching her healthy weight goal.  Vanessa Shelton is currently in the action stage of change and ready to dedicate time achieving and maintaining a healthier weight. Vanessa Shelton is interested in becoming our patient and working on intensive lifestyle modifications including (but not limited to) diet and exercise for weight loss.  Patient is here for her weight loss visit (plan and to get established).  She did see me for an information session on 05/26/2023.  She craves sweets at times.  Vanessa Shelton's habits were reviewed today and are as follows: Her family eats meals together, her desired weight loss is 69+ lbs, she has been heavy most of her life, she started gaining excessive weight in her teen years and 20's, her heaviest weight ever was 245 pounds, she is a picky eater and doesn't like to eat healthier foods, she has significant food cravings issues, she snacks frequently in the evenings, she is frequently drinking liquids with calories, she has problems with excessive hunger, and she struggles with emotional eating.  Depression Screen Vanessa Shelton's Food and Mood (modified PHQ-9) score was 19.  Subjective:   1. Other fatigue [name] admits to daytime somnolence and admits to waking up still tired. Patient has a history of symptoms of daytime fatigue and morning fatigue. Vanessa Shelton generally gets 4 or 6 hours of sleep per night, and states that she has nightime awakenings. Snoring is present. Apneic episodes are present. Epworth Sleepiness Score is 9.   2. SOB (shortness of breath) on exertion Vanessa Shelton notes increasing shortness of breath with exercising and seems to be  worsening over time with weight gain. She notes getting out of breath sooner with activity than she used to. This has not gotten worse recently. Vanessa Shelton denies shortness of breath at rest or orthopnea.  3. Type 2 diabetes mellitus without complication, without long-term current use of insulin (HCC) Patient is taking metformin and glimepiride.  Her last A1c was 6.1.  Her fasting blood sugars average at 100, and she denies low blood sugars.  4. Other sleep apnea Patient is not wearing her CPAP at this time.  5. Other hyperlipidemia Patient is currently taking Crestor.  6. Vitamin D deficiency Patient is taking OTC vitamin D, but she is not taking other vitamin supplementation.  7. B12 deficiency Patient notes decrease in energy.  8. Health care maintenance Given obesity.   Assessment/Plan:   1. Other fatigue [name] does feel that her weight is causing her energy to be lower than it should be. Fatigue may be related to obesity, depression or many other causes. Labs will be ordered, and in the meanwhile, Vanessa Shelton will focus on self care including making healthy food choices, increasing physical activity and focusing on stress reduction.  - EKG 12-Lead  2. SOB (shortness of breath) on exertion [name] does feel that she gets out of breath more easily that she used to when she exercises. Vanessa Shelton's shortness of breath appears to be obesity related and exercise induced. She has agreed to work on weight loss and gradually increase exercise to treat her exercise induced shortness of breath. Will continue to monitor closely.  3. Type 2 diabetes mellitus without complication, without  long-term current use of insulin (HCC) We will check labs today.  Patient will continue her medications as directed.  - Insulin, random  4. Other sleep apnea We discussed the importance of good sleep.  5. Other hyperlipidemia Patient will continue Crestor, and she will work on eliminating trans fats.  6. Vitamin  D deficiency We will check labs today, we will follow-up at patient's next visit.  - VITAMIN D 25 Hydroxy (Vit-D Deficiency, Fractures)  7. B12 deficiency We will check labs today, we will follow-up at patient's next visit.  - Vitamin B12  8. Health care maintenance We will check labs today.  EKG and IC were done today and reviewed with the patient.  - VITAMIN D 25 Hydroxy (Vit-D Deficiency, Fractures) - Insulin, random - Vitamin B12  9. Depression screening Vanessa Shelton had a positive depression screening. Depression is commonly associated with obesity and often results in emotional eating behaviors. We will monitor this closely and work on CBT to help improve the non-hunger eating patterns. Referral to Psychology may be required if no improvement is seen as she continues in our clinic.  10. Class 2 obesity due to excess calories without serious comorbidity with body mass index (BMI) of 37.0 to 37.9 in adult Vanessa Shelton is currently in the action stage of change and her goal is to continue with weight loss efforts. I recommend Vanessa Shelton begin the structured treatment plan as follows:  She has agreed to the Category 3 Plan + 100 calories.  Meal planning was discussed.  Review labs with the patient from 05/12/2023, CMP, lipids, A1c, and glucose.  Exercise goals: No exercise has been prescribed at this time.   Behavioral modification strategies: increasing lean protein intake, decreasing simple carbohydrates, increasing vegetables, increasing water intake, decreasing eating out, no skipping meals, meal planning and cooking strategies, keeping healthy foods in the home, and planning for success.  She was informed of the importance of frequent follow-up visits to maximize her success with intensive lifestyle modifications for her multiple health conditions. She was informed we would discuss her lab results at her next visit unless there is a critical issue that needs to be addressed sooner. Vanessa Shelton agreed  to keep her next visit at the agreed upon time to discuss these results.  Objective:   Blood pressure 129/84, pulse 83, temperature 98.2 F (36.8 C), height 5\' 4"  (1.626 m), weight 219 lb (99.3 kg), SpO2 98%. Body mass index is 37.59 kg/m.  EKG: Normal sinus rhythm, rate 79 BPM.  Indirect Calorimeter completed today shows a VO2 of 330 and a REE of 2275.  Her calculated basal metabolic rate is 1610 thus her basal metabolic rate is better than expected.  General: Cooperative, alert, well developed, in no acute distress. HEENT: Conjunctivae and lids unremarkable. Cardiovascular: Regular rhythm.  Lungs: Normal work of breathing. Neurologic: No focal deficits.   Lab Results  Component Value Date   CREATININE 0.63 05/12/2023   BUN 9 05/12/2023   NA 140 05/12/2023   K 4.3 05/12/2023   CL 104 05/12/2023   CO2 22 05/12/2023   Lab Results  Component Value Date   ALT 27 02/09/2023   AST 25 02/09/2023   ALKPHOS 95 02/09/2023   BILITOT 0.3 02/09/2023   Lab Results  Component Value Date   HGBA1C 6.1 (H) 05/12/2023   HGBA1C 6.0 (H) 02/09/2023   HGBA1C 10.6 (H) 11/04/2022   Lab Results  Component Value Date   INSULIN 23.8 06/23/2023   Lab Results  Component Value  Date   TSH 0.945 11/04/2022   Lab Results  Component Value Date   CHOL 129 05/12/2023   HDL 43 05/12/2023   LDLCALC 69 05/12/2023   TRIG 87 05/12/2023   CHOLHDL 3.0 05/12/2023   Lab Results  Component Value Date   WBC 11.1 (H) 02/09/2023   HGB 14.2 02/09/2023   HCT 42.8 02/09/2023   MCV 95 02/09/2023   PLT 375 02/09/2023   No results found for: "IRON", "TIBC", "FERRITIN"  Attestation Statements:   Reviewed by clinician on day of visit: allergies, medications, problem list, medical history, surgical history, family history, social history, and previous encounter notes.  Time spent on visit including pre-visit chart review and post-visit charting and care was 40 minutes.   Trude Mcburney, am acting as  Energy manager for [employer], DO.  I have reviewed the above documentation for accuracy and completeness, and I agree with the above. Corinna Capra, DO

## 2023-07-02 ENCOUNTER — Other Ambulatory Visit: Payer: Self-pay | Admitting: Family Medicine

## 2023-07-02 DIAGNOSIS — E119 Type 2 diabetes mellitus without complications: Secondary | ICD-10-CM

## 2023-07-11 ENCOUNTER — Ambulatory Visit: Payer: BC Managed Care – PPO | Admitting: Bariatrics

## 2023-07-15 ENCOUNTER — Ambulatory Visit: Payer: BC Managed Care – PPO | Admitting: Professional Counselor

## 2023-07-19 ENCOUNTER — Encounter: Payer: Self-pay | Admitting: Family Medicine

## 2023-07-19 ENCOUNTER — Telehealth: Payer: BC Managed Care – PPO | Admitting: Physician Assistant

## 2023-07-19 DIAGNOSIS — J069 Acute upper respiratory infection, unspecified: Secondary | ICD-10-CM | POA: Diagnosis not present

## 2023-07-19 MED ORDER — FLUTICASONE PROPIONATE 50 MCG/ACT NA SUSP: 2.0000 | Freq: Every day | NASAL | 0 refills | Status: DC

## 2023-07-19 MED ORDER — CETIRIZINE-PSEUDOEPHEDRINE ER 5-120 MG PO TB12: 1.0000 | ORAL_TABLET | Freq: Two times a day (BID) | ORAL | 0 refills | Status: AC

## 2023-07-19 NOTE — Progress Notes (Signed)

## 2023-07-25 ENCOUNTER — Telehealth: Payer: Self-pay | Admitting: Family Medicine

## 2023-07-25 NOTE — Telephone Encounter (Signed)
[employer] screening form (Insurance)   Noted  Copied Sleeved  Original in PCP box Copy front desk folder

## 2023-07-30 NOTE — Progress Notes (Signed)
I have spent 5 minutes in review of e-visit questionnaire, review and updating patient chart, medical decision making and response to patient.   Laure Kidney, PA-C

## 2023-07-31 ENCOUNTER — Other Ambulatory Visit: Payer: Self-pay | Admitting: Family Medicine

## 2023-08-14 ENCOUNTER — Ambulatory Visit
Admission: EM | Admit: 2023-08-14 | Discharge: 2023-08-14 | Disposition: A | Discharge status: Home / Self Care | Payer: BC Managed Care – PPO | Attending: Nurse Practitioner | Admitting: Nurse Practitioner

## 2023-08-14 DIAGNOSIS — K047 Periapical abscess without sinus: Secondary | ICD-10-CM | POA: Diagnosis not present

## 2023-08-14 MED ORDER — CLINDAMYCIN HCL 300 MG PO CAPS: 300.0000 mg | ORAL_CAPSULE | Freq: Three times a day (TID) | ORAL | 0 refills | Status: AC

## 2023-08-14 NOTE — ED Provider Notes (Signed)
[facility]    CSN: [id number] Arrival date & time: 08/14/23  0946      History   Chief Complaint Chief Complaint  Patient presents with   Facial Swelling    HPI Vanessa Shelton is a 47 y.o. female.   The history is provided by the patient.   Patient presents with swelling to the right lower jaw that has been present for the past 24 hours.  Patient denies fever, chills, dental pain, dental trauma, injury, sore throat, headache, or ear pain.  Patient states that she has not had any recent dental work.  Reports she is not taking any medication for her symptoms.  Past Medical History:  Diagnosis Date   Anxiety    Asthma    Back pain    Chest pain    Constipation    Depression    Diabetes mellitus without complication (HCC)    GERD (gastroesophageal reflux disease)    High blood pressure    High cholesterol    Joint pain    Sleep apnea    Swelling of lower extremity    Tobacco abuse    Vitamin D deficiency     Patient Active Problem List   Diagnosis Date Noted   Health care maintenance 06/23/2023   Other hyperlipidemia 06/23/2023   SOB (shortness of breath) on exertion 06/23/2023   B12 deficiency 06/23/2023   Encounter for Papanicolaou smear of cervix 12/20/2022   Chronic back pain 11/18/2022   Depression, recurrent (HCC) 11/04/2022   Hyperglycemia due to type 2 diabetes mellitus (HCC) 03/31/2021   Impaired fasting glucose 03/30/2021   Bronchitis 03/30/2021   Female stress incontinence 03/30/2021   Nicotine dependence 03/30/2021   Sleep apnea 03/30/2021   Abnormal liver function 01/28/2021   Mixed hyperlipidemia 01/28/2021   Type 2 diabetes mellitus (HCC) 01/28/2021   Vitamin D deficiency 01/28/2021   Diabetes 1.5, 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 Authorizing Provider  clindamycin (CLEOCIN) 300 MG capsule Take 1 capsule (300 mg total) by mouth 3 (three) times daily for 7 days. 08/14/23 08/21/23 Yes Leath-Warren, Sadie Haber, NP  Blood Glucose Monitoring Suppl DEVI 1 each by Does not apply route in the morning, at noon, and at bedtime. May substitute to any manufacturer covered by patient's insurance. Patient not taking: Reported on 06/23/2023 11/05/22   Del Nigel Berthold, FNP  buPROPion (WELLBUTRIN XL) 300 MG 24 hr tablet Take 1 tablet (300 mg total) by mouth daily. 02/21/23   Gilmore Laroche, FNP  cyclobenzaprine (FLEXERIL) 5 MG  tablet Take 1 tablet (5 mg total) by mouth 3 (three) times daily as needed for muscle spasms. 11/18/22   Del Nigel Berthold, FNP  fluticasone (FLONASE ALLERGY RELIEF) 50 MCG/ACT nasal spray Place 2 sprays into both nostrils daily. 07/19/23   McLean, Darius, PA-C  glimepiride (AMARYL) 1 MG tablet TAKE 1 TABLET BY MOUTH EVERY DAY WITH BREAKFAST 07/04/23   Del Newman Nip, [place], FNP  hydrOXYzine (VISTARIL) 25 MG capsule Take 1 capsule (25 mg total) by mouth every 8 (eight) hours as needed. 04/24/23   Del Newman Nip, Tenna Child, FNP  Lancets (FREESTYLE) lancets USE TO TEST BLOOD SUGAR, AM, NOON & BEDTIME, 3 TIMES A DAY Patient not taking: Reported on 06/23/2023 01/10/23   Del Nigel Berthold, FNP  metFORMIN (GLUCOPHAGE) 500 MG tablet Take 1 tablet (500 mg total) by mouth daily with breakfast. 02/10/23   Del Newman Nip, Tenna Child, FNP  PARoxetine (PAXIL) 10 MG tablet TAKE 1 TABLET BY MOUTH EVERYDAY AT BEDTIME 05/17/23    Del Newman Nip, Tenna Child, FNP  rosuvastatin (CRESTOR) 5 MG tablet Take 1 tablet (5 mg total) by mouth daily. 11/05/22   Del Nigel Berthold, FNP    Family History Family History  Problem Relation Age of Onset   Heart defect Mother    Hypertension Mother    High Cholesterol Mother    Depression Mother    Anxiety disorder Mother    Diabetes Father    High Cholesterol Father    Sleep apnea Father    Hypertension Maternal Grandmother    Heart attack Maternal Grandfather    Breast cancer Other     Social History Social History   Tobacco Use   Smoking status: Every Day    Current packs/day: 1.00    Types: Cigarettes   Smokeless tobacco: Never  Substance Use Topics   Alcohol use: No   Drug use: No     Allergies   Penicillins, Bee venom, and Latex   Review of Systems Review of Systems Per HPI  Physical Exam Triage Vital Signs ED Triage Vitals [08/14/23 1024]  Encounter Vitals Group     BP (!) 141/85     Systolic BP Percentile      Diastolic BP Percentile      Pulse Rate 83     Resp 18     Temp 97.9 F (36.6 C)     Temp Source Oral     SpO2 95 %     Weight      Height      Head Circumference      Peak Flow      Pain Score 0     Pain Loc      Pain Education      Exclude from Growth Chart    No data found.  Updated Vital Signs BP (!) 141/85 (BP Location: Right Arm)   Pulse 83   Temp 97.9 F (36.6 C) (Oral)   Resp 18   LMP 07/23/2023 Comment: start  SpO2 95%   Visual Acuity Right Eye Distance:   Left Eye Distance:   Bilateral Distance:    Right Eye Near:   Left Eye Near:    Bilateral Near:     Physical Exam Vitals and nursing note reviewed.  Constitutional:      General: She is not in acute distress.    Appearance: Normal appearance.  HENT:     Head: Normocephalic.     Right Ear: Hearing, tympanic membrane, ear canal  and external ear normal.     Left Ear: Hearing, tympanic membrane, ear canal and external ear normal.     Nose: Nose  normal.     Mouth/Throat:     Lips: Pink.     Mouth: Mucous membranes are moist.     Dentition: Abnormal dentition. Gingival swelling and dental abscesses present. No dental tenderness.     Tongue: No lesions.     Pharynx: Oropharynx is clear. Uvula midline.     Comments: Gingival swelling from teeth 28 through 31 Eyes:     Extraocular Movements: Extraocular movements intact.     Pupils: Pupils are equal, round, and reactive to light.  Cardiovascular:     Rate and Rhythm: Normal rate and regular rhythm.     Pulses: Normal pulses.     Heart sounds: Normal heart sounds.  Pulmonary:     Effort: Pulmonary effort is normal. No respiratory distress.     Breath sounds: Normal breath sounds. No stridor. No wheezing, rhonchi or rales.  Abdominal:     General: Bowel sounds are normal.     Palpations: Abdomen is soft.     Tenderness: There is no abdominal tenderness.  Musculoskeletal:     Cervical back: Normal range of motion.  Lymphadenopathy:     Cervical: No cervical adenopathy.  Skin:    General: Skin is warm and dry.  Neurological:     General: No focal deficit present.     Mental Status: She is alert and oriented to person, place, and time.  Psychiatric:        Mood and Affect: Mood normal.      UC Treatments / Results  Labs (all labs ordered are listed, but only abnormal results are displayed) Labs Reviewed - No data to display  EKG   Radiology No results found.  Procedures Procedures (including critical care time)  Medications Ordered in UC Medications - No data to display  Initial Impression / Assessment and Plan / UC Course  I have reviewed the triage vital signs and the nursing notes.  Pertinent labs & imaging results that were available during my care of the patient were reviewed by me and considered in my medical decision making (see chart for details).  Suspect dental abscess at this time in the presence of swelling to the right lower jaw and gingival  swelling.  Will treat with clindamycin 300 mg 3 times daily for the next 7 days.  Supportive care recommendations were provided and discussed with the patient to include over-the-counter analgesics, warm salt water gargles, and warm or cool compresses as needed.  Patient was advised to follow-up with her dentist within the next 7 to 10 days for reevaluation, she was also given strict ER follow-up precautions.  Patient was in agreement with this plan of care and verbalized understanding.  All questions were answered.  Patient stable for discharge.  Final Clinical Impressions(s) / UC Diagnoses   Final diagnoses:  Dental abscess     Discharge Instructions      Take medication as prescribed. May take over-the-counter Tylenol extra strength 1 to 2 hours after ibuprofen for breakthrough pain. Warm salt water gargles 3-4 times daily until symptoms improve. Continue warm compresses to the affected area to help with pain and discomfort. Follow-up with your dentist within the next 7 to 10 days for reevaluation. Go to the emergency department immediately if you experience increased facial swelling, or new symptoms of fever, chills, or pain. Follow-up as needed.  ED Prescriptions     Medication Sig Dispense Auth. Provider   clindamycin (CLEOCIN) 300 MG capsule Take 1 capsule (300 mg total) by mouth 3 (three) times daily for 7 days. 21 capsule Leath-Warren, Sadie Haber, NP      PDMP not reviewed this encounter.   Abran Cantor, NP 08/14/23 1045

## 2023-08-14 NOTE — Discharge Instructions (Signed)
Take medication as prescribed. May take over-the-counter Tylenol extra strength 1 to 2 hours after ibuprofen for breakthrough pain. Warm salt water gargles 3-4 times daily until symptoms improve. Continue warm compresses to the affected area to help with pain and discomfort. Follow-up with your dentist within the next 7 to 10 days for reevaluation. Go to the emergency department immediately if you experience increased facial swelling, or new symptoms of fever, chills, or pain. Follow-up as needed.

## 2023-08-14 NOTE — ED Triage Notes (Signed)
Pt has right side jaw swelling that is sensitive to the touch  x 1 day.   Denies dental pain

## 2023-09-11 NOTE — Patient Instructions (Signed)

## 2023-09-12 ENCOUNTER — Encounter: Payer: BC Managed Care – PPO | Admitting: Family Medicine

## 2023-09-12 NOTE — Progress Notes (Signed)
 Canceled.

## 2023-09-26 ENCOUNTER — Ambulatory Visit: Payer: Self-pay

## 2023-09-29 ENCOUNTER — Telehealth: Payer: BC Managed Care – PPO | Admitting: Physician Assistant

## 2023-09-29 DIAGNOSIS — R6889 Other general symptoms and signs: Secondary | ICD-10-CM | POA: Diagnosis not present

## 2023-09-29 MED ORDER — OSELTAMIVIR PHOSPHATE 75 MG PO CAPS: 75.0000 mg | ORAL_CAPSULE | Freq: Two times a day (BID) | ORAL | 0 refills | Status: DC

## 2023-09-29 NOTE — Progress Notes (Signed)
I have spent 5 minutes in review of e-visit questionnaire, review and updating patient chart, medical decision making and response to patient.   Mia Milan Cody Jacklynn Dehaas, PA-C    

## 2023-09-29 NOTE — Progress Notes (Signed)

## 2023-09-29 NOTE — Addendum Note (Signed)
Addended by: Waldon Merl on: 09/29/2023 11:45 AM   Modules accepted: Orders

## 2023-10-02 ENCOUNTER — Other Ambulatory Visit: Payer: Self-pay | Admitting: Family Medicine

## 2023-10-02 DIAGNOSIS — F419 Anxiety disorder, unspecified: Secondary | ICD-10-CM

## 2023-11-08 ENCOUNTER — Other Ambulatory Visit: Payer: Self-pay | Admitting: Family Medicine

## 2023-11-08 DIAGNOSIS — E785 Hyperlipidemia, unspecified: Secondary | ICD-10-CM

## 2023-11-11 ENCOUNTER — Encounter: Payer: Self-pay | Admitting: Emergency Medicine

## 2023-11-11 ENCOUNTER — Other Ambulatory Visit: Payer: Self-pay | Admitting: Family Medicine

## 2023-11-11 ENCOUNTER — Ambulatory Visit
Admission: EM | Admit: 2023-11-11 | Discharge: 2023-11-11 | Disposition: A | Discharge status: Home / Self Care | Attending: Family Medicine | Admitting: Family Medicine

## 2023-11-11 DIAGNOSIS — K047 Periapical abscess without sinus: Secondary | ICD-10-CM

## 2023-11-11 DIAGNOSIS — E119 Type 2 diabetes mellitus without complications: Secondary | ICD-10-CM

## 2023-11-11 MED ORDER — LIDOCAINE VISCOUS HCL 2 % MT SOLN: 10.0000 mL | OROMUCOSAL | 0 refills | Status: DC | PRN

## 2023-11-11 MED ORDER — CLINDAMYCIN HCL 300 MG PO CAPS
300.0000 mg | ORAL_CAPSULE | Freq: Two times a day (BID) | ORAL | 0 refills | Status: DC
Start: 2023-11-11 — End: 2024-05-10

## 2023-11-11 MED ORDER — CHLORHEXIDINE GLUCONATE 0.12 % MT SOLN: 15.0000 mL | Freq: Two times a day (BID) | OROMUCOSAL | 0 refills | Status: AC

## 2023-11-11 NOTE — ED Triage Notes (Signed)
 Upper left sided dental pain x 3 days.  Has been taking tylenol and advil for pain.  Also has been using oragel.

## 2023-11-11 NOTE — ED Provider Notes (Signed)
 [facility]    CSN: [id number] Arrival date & time: 11/11/23  1527      History   Chief Complaint No chief complaint on file.   HPI Vanessa Shelton is a 48 y.o. female.   Patient presenting today with 3-day history of left upper dental pain in the area of a broken molar.  She states that she has gotten progressively more swollen in this area of her face and the pain continues to get worse.  Denies fever, chills, difficulty breathing or swallowing, bleeding or drainage.  So far has not been able to get in with dentist.  Taking Tylenol ibuprofen with minimal relief.    Past Medical History:  Diagnosis Date   Anxiety    Asthma    Back pain    Chest pain    Constipation    Depression    Diabetes mellitus without complication (HCC)    GERD (gastroesophageal reflux disease)    High blood pressure    High cholesterol    Joint pain    Sleep apnea    Swelling of lower extremity    Tobacco abuse    Vitamin D deficiency     Patient Active Problem List   Diagnosis Date Noted   Health care maintenance 06/23/2023   Other hyperlipidemia 06/23/2023   SOB (shortness of breath) on exertion 06/23/2023   B12 deficiency 06/23/2023   Encounter for Papanicolaou smear of cervix 12/20/2022   Chronic back pain 11/18/2022   Depression, recurrent (HCC) 11/04/2022   Hyperglycemia due to type 2 diabetes mellitus (HCC) 03/31/2021   Impaired fasting glucose 03/30/2021   Bronchitis 03/30/2021   Female stress incontinence 03/30/2021   Nicotine dependence 03/30/2021   Sleep apnea 03/30/2021   Abnormal liver function 01/28/2021   Mixed hyperlipidemia 01/28/2021   Type 2 diabetes mellitus (HCC) 01/28/2021   Vitamin D deficiency 01/28/2021   Diabetes 1.5, 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 Authorizing Provider  chlorhexidine (PERIDEX) 0.12 % solution Use as directed 15 mLs in the mouth or throat 2 (two) times daily. 11/11/23  Yes Particia Nearing, PA-C  clindamycin (CLEOCIN) 300 MG capsule Take 1 capsule (300 mg total) by mouth 2 (two) times daily. 11/11/23  Yes Particia Nearing, PA-C  lidocaine (XYLOCAINE) 2 % solution Use as directed 10 mLs in the mouth or throat every 3 (three) hours as needed. 11/11/23  Yes Particia Nearing, PA-C  Blood Glucose Monitoring Suppl DEVI 1 each by Does not apply route in the morning, at noon, and  at bedtime. May substitute to any manufacturer covered by patient's insurance. Patient not taking: Reported on 06/23/2023 11/05/22   Del Nigel Berthold, FNP  buPROPion (WELLBUTRIN XL) 300 MG 24 hr tablet TAKE 1 TABLET BY MOUTH EVERY DAY 10/03/23   Del Newman Nip, Tenna Child, FNP  cyclobenzaprine (FLEXERIL) 5 MG tablet Take 1 tablet (5 mg total) by mouth 3 (three) times daily as needed for muscle spasms. 11/18/22   Del Nigel Berthold, FNP  fluticasone (FLONASE ALLERGY RELIEF) 50 MCG/ACT nasal spray Place 2 sprays into both nostrils daily. 07/19/23   McLean, Darius, PA-C  glimepiride (AMARYL) 1 MG tablet TAKE 1 TABLET BY MOUTH EVERY DAY WITH BREAKFAST 07/04/23   Del Newman Nip, [place], FNP  hydrOXYzine (VISTARIL) 25 MG capsule Take 1 capsule (25 mg total) by mouth every 8 (eight) hours as needed. 04/24/23   Del Newman Nip, Tenna Child, FNP  Lancets (FREESTYLE) lancets USE TO TEST BLOOD SUGAR, AM, NOON & BEDTIME, 3 TIMES A DAY Patient not  taking: Reported on 06/23/2023 01/10/23   Del Nigel Berthold, FNP  metFORMIN (GLUCOPHAGE) 500 MG tablet Take 1 tablet (500 mg total) by mouth daily with breakfast. 02/10/23   Del Newman Nip, Tenna Child, FNP  PARoxetine (PAXIL) 10 MG tablet TAKE 1 TABLET BY MOUTH EVERYDAY AT BEDTIME 05/17/23   Del Newman Nip, Tenna Child, FNP  rosuvastatin (CRESTOR) 5 MG tablet TAKE 1 TABLET (5 MG TOTAL) BY MOUTH DAILY. 11/08/23   Del Nigel Berthold, FNP    Family History Family History  Problem Relation Age of Onset   Heart defect Mother    Hypertension Mother    High Cholesterol Mother    Depression Mother    Anxiety disorder Mother    Diabetes Father    High Cholesterol Father    Sleep apnea Father    Hypertension Maternal Grandmother    Heart attack Maternal Grandfather    Breast cancer Other     Social History Social History   Tobacco Use   Smoking status: Every Day    Current packs/day: 1.00    Types: Cigarettes   Smokeless tobacco: Never  Substance Use Topics   Alcohol use: No   Drug use: No     Allergies   Penicillins, Bee venom, and Latex   Review of Systems Review of Systems PER HPI  Physical Exam Triage Vital Signs ED Triage Vitals  Encounter Vitals Group     BP 11/11/23 1604 (!) 145/85     Systolic BP Percentile --      Diastolic BP Percentile --      Pulse Rate 11/11/23 1604 85     Resp 11/11/23 1604 18     Temp 11/11/23 1604 98.6 F (37 C)     Temp Source 11/11/23 1604 Oral     SpO2 11/11/23 1604 95 %     Weight --      Height --      Head Circumference --      Peak Flow --      Pain Score 11/11/23 1605 8     Pain Loc --      Pain Education --      Exclude from Growth Chart --    No data found.  Updated Vital Signs BP (!) 145/85 (BP Location: Right Arm)   Pulse 85   Temp 98.6 F (37 C) (Oral)   Resp 18   LMP 10/18/2023 (Exact Date)   SpO2 95%   Visual Acuity Right Eye  Distance:   Left Eye Distance:   Bilateral Distance:    Right Eye Near:    Left Eye Near:    Bilateral Near:     Physical Exam Vitals and nursing note reviewed.  Constitutional:      Appearance: Normal appearance. She is not ill-appearing.  HENT:     Head: Atraumatic.     Mouth/Throat:     Mouth: Mucous membranes are moist.     Pharynx: Oropharynx is clear.     Comments: Left upper broken molar with gingival erythema, edema and coordinating facial swelling to this area. Eyes:     Extraocular Movements: Extraocular movements intact.     Conjunctiva/sclera: Conjunctivae normal.  Cardiovascular:     Rate and Rhythm: Normal rate and regular rhythm.     Heart sounds: Normal heart sounds.  Pulmonary:     Effort: Pulmonary effort is normal.     Breath sounds: Normal breath sounds.  Musculoskeletal:        General: Normal range of motion.     Cervical back: Normal range of motion and neck supple.  Skin:    General: Skin is warm and dry.  Neurological:     Mental Status: She is alert and oriented to person, place, and time.  Psychiatric:        Mood and Affect: Mood normal.        Thought Content: Thought content normal.        Judgment: Judgment normal.      UC Treatments / Results  Labs (all labs ordered are listed, but only abnormal results are displayed) Labs Reviewed - No data to display  EKG   Radiology No results found.  Procedures Procedures (including critical care time)  Medications Ordered in UC Medications - No data to display  Initial Impression / Assessment and Plan / UC Course  I have reviewed the triage vital signs and the nursing notes.  Pertinent labs & imaging results that were available during my care of the patient were reviewed by me and considered in my medical decision making (see chart for details).     Treat with clindamycin, Peridex, viscous lidocaine, over-the-counter pain relievers and follow-up as soon as possible with dentist.  Final Clinical Impressions(s) / UC Diagnoses   Final diagnoses:  Dental  infection   Discharge Instructions   None    ED Prescriptions     Medication Sig Dispense Auth. Provider   clindamycin (CLEOCIN) 300 MG capsule Take 1 capsule (300 mg total) by mouth 2 (two) times daily. 14 capsule Particia Nearing, PA-C   lidocaine (XYLOCAINE) 2 % solution Use as directed 10 mLs in the mouth or throat every 3 (three) hours as needed. 100 mL Particia Nearing, PA-C   chlorhexidine (PERIDEX) 0.12 % solution Use as directed 15 mLs in the mouth or throat 2 (two) times daily. 120 mL Particia Nearing, [place]      PDMP not reviewed this encounter.   Particia Nearing, [place] 11/11/23 1714

## 2023-11-15 ENCOUNTER — Ambulatory Visit (INDEPENDENT_AMBULATORY_CARE_PROVIDER_SITE_OTHER): Admitting: Nurse Practitioner

## 2023-11-15 ENCOUNTER — Ambulatory Visit: Payer: Self-pay | Admitting: Family Medicine

## 2023-11-15 VITALS — BP 131/76 | HR 75 | Temp 98.0°F | Ht 64.0 in | Wt 226.4 lb

## 2023-11-15 DIAGNOSIS — K0889 Other specified disorders of teeth and supporting structures: Secondary | ICD-10-CM | POA: Insufficient documentation

## 2023-11-15 MED ORDER — NAPROXEN 500 MG PO TBEC: 500.0000 mg | DELAYED_RELEASE_TABLET | Freq: Three times a day (TID) | ORAL | 0 refills | Status: DC

## 2023-11-15 NOTE — Telephone Encounter (Signed)
  Chief Complaint: left upper dental pain/facial swelling, on antibiotics Symptoms: left upper dental abscess, dental pain, left facial swelling, left neck lymph node swelling Frequency: x 1 week, worsened since this AM Pertinent Negatives: Patient denies fever, pus discharge from mouth/tooth. Disposition: [] ED /[] Urgent Care (no appt availability in office) / [x] Appointment(In office/virtual)/ []  [facility]/ [] Home Care/ [] Refused Recommended Disposition /[] [place] [facility]/ []  Follow-up with PCP Additional Notes: Patient seen in urgent care on 11/11/23 for dental infection and diagnosed with dental abscess. Patient was given Peridex, clindamycin and viscous lidocaine. Patient states she woke up at 0300 this AM with excruciating pain, she states it is very sensitive to touch. She states the swelling in her face has improved. She states she used the lidocaine and took a Naproxen which helped with the pain. Patient states she does not have a dentist to follow up with, needs to find a new one. Patient agreeable to appointment today with [facility].  Copied from CRM [id number]. Topic: Clinical - Red Word Triage >> Nov 15, 2023 10:02 AM Geroge Baseman wrote: Red Word that prompted transfer to Nurse Triage: Patient states her medications from the doctor are not working, she is in a lot of pain. Lymph  nodes swollen in neck. All on the left side. Reason for Disposition  [1] SEVERE pain (e.g., excruciating, unable to eat, unable to do any normal activities) AND [2] not improved 2 hours after pain medicine  Answer Assessment - Initial Assessment Questions 1. LOCATION: "Which tooth is hurting?"  (e.g., right-side/left-side, upper/lower, front/back)     Upper left back tooth.  2. ONSET: "When did the toothache start?"  (e.g., hours, days)      She states it started last Tuesday night (1 week ago). Worsened this morning.  3. SEVERITY: "How bad is the toothache?"  (Scale 1-10; mild, moderate or severe)    - MILD (1-3): doesn't interfere with chewing    - MODERATE (4-7): interferes with chewing, interferes with normal activities, awakens from sleep     - SEVERE (8-10): unable to eat, unable to do any normal activities, excruciating pain        7-8/10, she states she rubbed the lidocaine on the tooth/gum and took a Naproxen.  4. SWELLING: "Is there any visible swelling of your face?"     Left side of face from cheek to jaw and left neck lymph node swollen (feels like a golf ball).  5. OTHER SYMPTOMS: "Do you have any other symptoms?" (e.g., fever)    Bloody discharge from tooth.  6. PREGNANCY: "Is there any chance you are pregnant?" "When was your last menstrual period?"     LMP: currently on it.  Protocols used: Toothache-A-AH

## 2023-11-15 NOTE — Progress Notes (Signed)
 Acute Office Visit  Subjective:     Patient ID: Vanessa Shelton, female    DOB: 1975/11/06, 48 y.o.   MRN: [id number]  Chief Complaint  Patient presents with   Dental Pain    Left tooth pain started last week woke up this morning in more pain    Dental Pain    Vanessa Shelton is 48 yrs old female seen 11/15/2023 for an cute for dental pain. She was seen multiple time at the ED 08/14/2023  right lower and again 11/11/2023 left upper jaw molar. She is current on ATB, Peridex, and lidocaine and reports minimal relieve with lidocaine. Sh has not seen a regular dentist fro yrs. " I was refer to a dentist, they were rude and decided not to go back" Advise her to call her insurance for reference. She denies fever, chills, , dental trauma, injury, sore throat, headache, or ear pain. Patient states that she has not had any recent dental work . 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   Female stress incontinence 03/30/2021   Hyperglycemia due to type 2 diabetes mellitus (HCC) 03/31/2021   Mixed hyperlipidemia 01/28/2021   Nicotine dependence 03/30/2021   Sleep apnea 03/30/2021   Type 2 diabetes mellitus (HCC) 01/28/2021   Vitamin D deficiency 01/28/2021   Depression, recurrent (HCC) 11/04/2022   Chronic back pain 11/18/2022   Encounter for Papanicolaou smear of cervix 12/20/2022   Health care maintenance 06/23/2023   Other hyperlipidemia 06/23/2023   SOB (shortness of breath) on exertion 06/23/2023   B12 deficiency 06/23/2023   Pain, dental 11/15/2023   Resolved Ambulatory Problems    Diagnosis Date Noted   No  Resolved Ambulatory Problems   Past Medical History:  Diagnosis Date   Anxiety    Asthma    Back pain    Chest pain    Constipation    Depression    Diabetes mellitus without complication (HCC)    GERD (gastroesophageal reflux disease)    High blood pressure    High cholesterol    Joint pain    Swelling of lower extremity     ROS Negative unless indicated in HPI    Objective:    BP 131/76   Pulse 75   Temp 98 F (36.7 C) (Temporal)   Ht 5\' 4"  (1.626 m)   Wt 226 lb 6.4 oz (102.7 kg)   LMP 10/18/2023 (Exact Date)   SpO2 97%   BMI 38.86 kg/m  BP Readings from Last 3 Encounters:  11/15/23 131/76  11/11/23 (!) 145/85  08/14/23 (!) 141/85   Wt Readings from Last 3 Encounters:  11/15/23 226 lb 6.4 oz (102.7 kg)  06/23/23 219 lb (99.3 kg)  05/26/23 213 lb (96.6 kg)      Physical Exam Vitals and nursing note reviewed.  Constitutional:      General: She is not in acute distress. HENT:     Head: Normocephalic and atraumatic.     Mouth/Throat:     Dentition: Dental tenderness, gingival swelling and dental caries present.     Comments: Left upper broken molar with gingival  erythema, edema facial swelling  Eyes:     General: No scleral icterus.    Extraocular Movements: Extraocular movements intact.     Conjunctiva/sclera: Conjunctivae normal.     Pupils: Pupils are equal, round, and reactive to light.  Cardiovascular:     Heart sounds: Normal heart sounds.  Pulmonary:     Effort: Pulmonary effort is normal.     Breath sounds: Normal breath sounds.  Musculoskeletal:     Right lower leg: No edema.     Left lower leg: No edema.  Skin:    General: Skin is warm and dry.     Findings: No rash.  Neurological:     Mental Status: She is alert and oriented to person, place, and time.  Psychiatric:        Mood and Affect: Mood normal.        Thought Content: Thought content normal.        Judgment: Judgment normal.    No results found for any visits on  11/15/23.      Assessment & Plan:  Pain, dental -     Naproxen; Take 1 tablet (500 mg total) by mouth 3 (three) times daily with meals.  Dispense: 30 tablet; Refill: 0   Vanessa Shelton is 48 yrs old Caucasian female seen fro dental pain, no acute distress Dental: Naprosyn 500 mg TID PRN with food, and continue all previously prescribed medication from the urgent care/ED  Advised client to call the insurance company to secure interest or dentist for dental care Return if symptoms worsen or fail to improve, for PCP.  Arrie Aran Santa Lighter, [place] patient has not had medication for about 1 [facility] [address] [place], [place] [address] [phone]  Note: This document was prepared by [employer] voice dictation technology and any errors that results from this process are unintentional.

## 2023-11-25 ENCOUNTER — Ambulatory Visit: Payer: BC Managed Care – PPO | Admitting: Family Medicine

## 2023-11-25 ENCOUNTER — Encounter: Payer: Self-pay | Admitting: Family Medicine

## 2023-11-25 VITALS — BP 120/68 | HR 75 | Ht 64.0 in | Wt 224.0 lb

## 2023-11-25 DIAGNOSIS — F419 Anxiety disorder, unspecified: Secondary | ICD-10-CM

## 2023-11-25 DIAGNOSIS — E119 Type 2 diabetes mellitus without complications: Secondary | ICD-10-CM | POA: Diagnosis not present

## 2023-11-25 DIAGNOSIS — G4730 Sleep apnea, unspecified: Secondary | ICD-10-CM | POA: Diagnosis not present

## 2023-11-25 DIAGNOSIS — E038 Other specified hypothyroidism: Secondary | ICD-10-CM

## 2023-11-25 DIAGNOSIS — Z6838 Body mass index (BMI) 38.0-38.9, adult: Secondary | ICD-10-CM

## 2023-11-25 DIAGNOSIS — E1169 Type 2 diabetes mellitus with other specified complication: Secondary | ICD-10-CM | POA: Diagnosis not present

## 2023-11-25 DIAGNOSIS — Z7984 Long term (current) use of oral hypoglycemic drugs: Secondary | ICD-10-CM

## 2023-11-25 DIAGNOSIS — Z1211 Encounter for screening for malignant neoplasm of colon: Secondary | ICD-10-CM

## 2023-11-25 DIAGNOSIS — Z1231 Encounter for screening mammogram for malignant neoplasm of breast: Secondary | ICD-10-CM

## 2023-11-25 DIAGNOSIS — F32A Depression, unspecified: Secondary | ICD-10-CM

## 2023-11-25 MED ORDER — PAROXETINE HCL 20 MG PO TABS
20.0000 mg | ORAL_TABLET | Freq: Every day | ORAL | 3 refills | Status: DC
Start: 2023-11-25 — End: 2023-12-20

## 2023-11-25 MED ORDER — GABAPENTIN 300 MG PO CAPS
300.0000 mg | ORAL_CAPSULE | Freq: Two times a day (BID) | ORAL | 2 refills | Status: DC
Start: 2023-11-25 — End: 2024-04-27

## 2023-11-25 NOTE — Assessment & Plan Note (Signed)
 Last hemoglobin A1c 6.1 Labs ordered today awaiting results will follow up. Patient reports taking Metformin 500 daily and glimepiride 1 mg daily.Discussed Non pharmacological interventions such as low carb diet,high in protein, vegetables and fruit discussed. Educated on importance of physical activity 150 minutes per week. Discussed signs and symptoms of hypoglycemia, & hyperglycemia and need to present to the ED if symptoms occurs.Follow up in 3 months or sooner if needed. Patient verbalizes understanding regarding plan of care and all questions answered. Ophthalmology referral placed, Foot exam within desired limits

## 2023-11-25 NOTE — Progress Notes (Signed)
 Established Patient Office Visit   Subjective  Patient ID: Vanessa Shelton, female    DOB: April 11, 1976  Age: 48 y.o. MRN: [id number]  Chief Complaint  Patient presents with   Medical Management of Chronic Issues    6 month DM Follow up.     She  has a past medical history of Anxiety, Asthma, Back pain, Chest pain, Constipation, Depression, Diabetes mellitus without complication (HCC), GERD (gastroesophageal reflux disease), High blood pressure, High cholesterol, Joint pain, Sleep apnea, Swelling of lower extremity, Tobacco abuse, and Vitamin D deficiency.  HPI Patient presents to the clinic for chronic follow up. For the details of today's visit, please refer to assessment and plan.   Review of Systems  Constitutional:  Negative for chills and fever.  Eyes:  Negative for blurred vision.  Respiratory:  Negative for shortness of breath.   Cardiovascular:  Negative for chest pain.  Gastrointestinal:  Negative for abdominal pain.  Neurological:  Negative for dizziness and headaches.  Psychiatric/Behavioral:  The patient is nervous/anxious.       Objective:     BP 120/68   Pulse 75   Ht 5\' 4"  (1.626 m)   Wt 224 lb 0.6 oz (101.6 kg)   LMP 11/13/2023 (Exact Date) Comment: March 9-14 last cycle  SpO2 (!) 89%   BMI 38.46 kg/m  BP Readings from Last 3 Encounters:  11/25/23 120/68  11/15/23 131/76  11/11/23 (!) 145/85      Physical Exam Vitals reviewed.  Constitutional:      General: She is not in acute distress.    Appearance: Normal appearance. She is not ill-appearing, toxic-appearing or diaphoretic.  HENT:     Head: Normocephalic.  Eyes:     General:        Right eye: No discharge.        Left eye: No discharge.     Conjunctiva/sclera: Conjunctivae normal.  Cardiovascular:     Rate and Rhythm: Normal rate.     Pulses: Normal pulses.     Heart sounds: Normal heart sounds.  Pulmonary:     Effort: Pulmonary effort is normal. No respiratory distress.     Breath  sounds: Normal breath sounds.  Abdominal:     Tenderness: There is no abdominal tenderness. There is no right CVA tenderness, left CVA tenderness or guarding.  Musculoskeletal:        General: Normal range of motion.  Skin:    General: Skin is warm and dry.     Capillary Refill: Capillary refill takes less than 2 seconds.  Neurological:     Mental Status: She is alert.     Coordination: Coordination normal.     Gait: Gait normal.  Psychiatric:        Mood and Affect: Mood normal.        Behavior: Behavior normal.      No results found for any visits on 11/25/23.  The ASCVD Risk score (Arnett DK, et al., 2019) failed to calculate for the following reasons:   The valid total cholesterol range is 130 to 320 mg/dL    Assessment & Plan:  Type 2 diabetes mellitus without complication, without long-term current use of insulin (HCC) Assessment & Plan: Last hemoglobin A1c 6.1 Labs ordered today awaiting results will follow up. Patient reports taking Metformin 500 daily and glimepiride 1 mg daily.Discussed Non pharmacological interventions such as low carb diet,high in protein, vegetables and fruit discussed. Educated on importance of physical activity 150 minutes per week.  Discussed signs and symptoms of hypoglycemia, & hyperglycemia and need to present to the ED if symptoms occurs.Follow up in 3 months or sooner if needed. Patient verbalizes understanding regarding plan of care and all questions answered. Ophthalmology referral placed, Foot exam within desired limits   Orders: -     Hemoglobin A1c -     Microalbumin / creatinine urine ratio -     Ambulatory referral to Ophthalmology  TSH (thyroid-stimulating hormone deficiency) -     TSH + free T4  Class 2 severe obesity due to excess calories with serious comorbidity and body mass index (BMI) of 38.0 to 38.9 in adult (HCC) -     Lipid panel -     CBC with Differential/Platelet -     CMP14+EGFR  Sleep apnea, unspecified type -      Ambulatory referral to Sleep Studies  Screening mammogram for breast cancer -     3D Screening Mammogram, Left and Right; Future  Screening for colon cancer -     Ambulatory referral to Gastroenterology  Anxiety and depression Assessment & Plan:    11/25/2023    9:49 AM 06/10/2023    2:12 PM 05/12/2023   11:21 AM 05/12/2023    8:08 AM  GAD 7 : Generalized Anxiety Score  Nervous, Anxious, on Edge 3 2 1 1   Control/stop worrying 3 3 2 1   Worry too much - different things 3 3 2 1   Trouble relaxing 2 2 0 1  Restless 1 1 0 0  Easily annoyed or irritable 3 2 2 2   Afraid - awful might happen 3 3 1 2   Total GAD 7 Score 18 16 8 8   Anxiety Difficulty Not difficult at all Not difficult at all Not difficult at all      [facility] Visit from 11/25/2023 in [facility]  PHQ-9 Total Score 14       Increased Paxil to 20 mg once daily, Trial on Gabapentin 300 twice daily Continue Wellbutrin 300 mg once daily.  We discussed several non-pharmacological approaches to managing anxiety and depression, including:  Establishing a consistent daily routine: This helps create structure and stability. Practicing mindfulness and relaxation techniques: Incorporating meditation, deep breathing exercises, or yoga to manage stress and improve emotional well-being. Engaging in regular physical activity: Aim for at least 30 minutes of exercise most days to boost mood and energy levels. Spending time outdoors: Exposure to natural light and fresh air can improve mental health. Building a support network: Encouraging social connections with friends, family, or support groups to reduce feelings of isolation. Prioritizing a balanced diet: Eating nutrient-rich foods while avoiding excessive amounts of processed foods, sugar, and unhealthy fats. Follow-up is recommended in 4-8 weeks to assess progress, with a referral to behavioral health for further support if needed.   Other  orders -     PARoxetine HCl; Take 1 tablet (20 mg total) by mouth daily.  Dispense: 30 tablet; Refill: 3 -     Gabapentin; Take 1 capsule (300 mg total) by mouth 2 (two) times daily.  Dispense: 60 capsule; Refill: 2    Return in about 4 months (around 03/26/2024), or if symptoms worsen or fail to improve, for type 2 diabetes, Front Office: Please schedule Mammogram.   Cruzita Lederer Newman Nip, FNP

## 2023-11-25 NOTE — Patient Instructions (Signed)

## 2023-11-25 NOTE — Assessment & Plan Note (Addendum)
    11/25/2023    9:49 AM 06/10/2023    2:12 PM 05/12/2023   11:21 AM 05/12/2023    8:08 AM  GAD 7 : Generalized Anxiety Score  Nervous, Anxious, on Edge 3 2 1 1   Control/stop worrying 3 3 2 1   Worry too much - different things 3 3 2 1   Trouble relaxing 2 2 0 1  Restless 1 1 0 0  Easily annoyed or irritable 3 2 2 2   Afraid - awful might happen 3 3 1 2   Total GAD 7 Score 18 16 8 8   Anxiety Difficulty Not difficult at all Not difficult at all Not difficult at all      [facility] Visit from 11/25/2023 in [facility]  PHQ-9 Total Score 14       Increased Paxil to 20 mg once daily, Trial on Gabapentin 300 twice daily Continue Wellbutrin 300 mg once daily.  We discussed several non-pharmacological approaches to managing anxiety and depression, including:  Establishing a consistent daily routine: This helps create structure and stability. Practicing mindfulness and relaxation techniques: Incorporating meditation, deep breathing exercises, or yoga to manage stress and improve emotional well-being. Engaging in regular physical activity: Aim for at least 30 minutes of exercise most days to boost mood and energy levels. Spending time outdoors: Exposure to natural light and fresh air can improve mental health. Building a support network: Encouraging social connections with friends, family, or support groups to reduce feelings of isolation. Prioritizing a balanced diet: Eating nutrient-rich foods while avoiding excessive amounts of processed foods, sugar, and unhealthy fats. Follow-up is recommended in 4-8 weeks to assess progress, with a referral to behavioral health for further support if needed.

## 2023-11-28 LAB — CMP14+EGFR
ALT: 54 IU/L — ABNORMAL HIGH (ref 0–32)
AST: 42 IU/L — ABNORMAL HIGH (ref 0–40)
Albumin: 4.2 g/dL (ref 3.9–4.9)
Alkaline Phosphatase: 118 IU/L (ref 44–121)
BUN/Creatinine Ratio: 12 (ref 9–23)
BUN: 7 mg/dL (ref 6–24)
Bilirubin Total: 0.5 mg/dL (ref 0.0–1.2)
CO2: 21 mmol/L (ref 20–29)
Calcium: 9.7 mg/dL (ref 8.7–10.2)
Chloride: 101 mmol/L (ref 96–106)
Creatinine, Ser: 0.6 mg/dL (ref 0.57–1.00)
Globulin, Total: 2.8 g/dL (ref 1.5–4.5)
Glucose: 138 mg/dL — ABNORMAL HIGH (ref 70–99)
Potassium: 4.4 mmol/L (ref 3.5–5.2)
Sodium: 138 mmol/L (ref 134–144)
Total Protein: 7 g/dL (ref 6.0–8.5)
eGFR: 113 mL/min/{1.73_m2} (ref >59)

## 2023-11-28 LAB — LIPID PANEL
Chol/HDL Ratio: 4.6 ratio — ABNORMAL HIGH (ref 0.0–4.4)
Cholesterol, Total: 184 mg/dL (ref 100–199)
HDL: 40 mg/dL (ref >39)
LDL Chol Calc (NIH): 117 mg/dL — ABNORMAL HIGH (ref 0–99)
Triglycerides: 149 mg/dL (ref 0–149)
VLDL Cholesterol Cal: 27 mg/dL (ref 5–40)

## 2023-11-28 LAB — CBC WITH DIFFERENTIAL/PLATELET
Basophils Absolute: 0.1 10*3/uL (ref 0.0–0.2)
Basos: 1 %
EOS (ABSOLUTE): 0.8 10*3/uL — ABNORMAL HIGH (ref 0.0–0.4)
Eos: 8 %
Hematocrit: 45.2 % (ref 34.0–46.6)
Hemoglobin: 15.2 g/dL (ref 11.1–15.9)
Immature Grans (Abs): 0.1 10*3/uL (ref 0.0–0.1)
Immature Granulocytes: 1 %
Lymphocytes Absolute: 3.2 10*3/uL — ABNORMAL HIGH (ref 0.7–3.1)
Lymphs: 29 %
MCH: 31.6 pg (ref 26.6–33.0)
MCHC: 33.6 g/dL (ref 31.5–35.7)
MCV: 94 fL (ref 79–97)
Monocytes Absolute: 0.8 10*3/uL (ref 0.1–0.9)
Monocytes: 7 %
Neutrophils Absolute: 6 10*3/uL (ref 1.4–7.0)
Neutrophils: 54 %
Platelets: 398 10*3/uL (ref 150–450)
RBC: 4.81 x10E6/uL (ref 3.77–5.28)
RDW: 11.9 % (ref 11.7–15.4)
WBC: 11 10*3/uL — ABNORMAL HIGH (ref 3.4–10.8)

## 2023-11-28 LAB — MICROALBUMIN / CREATININE URINE RATIO
Creatinine, Urine: 113.6 mg/dL
Microalb/Creat Ratio: 15 mg/g{creat} (ref 0–29)
Microalbumin, Urine: 16.6 ug/mL

## 2023-11-28 LAB — HEMOGLOBIN A1C
Est. average glucose Bld gHb Est-mCnc: 258 mg/dL
Hgb A1c MFr Bld: 10.6 % — ABNORMAL HIGH (ref 4.8–5.6)

## 2023-11-28 LAB — TSH+FREE T4
Free T4: 1.36 ng/dL (ref 0.82–1.77)
TSH: 1.32 u[IU]/mL (ref 0.450–4.500)

## 2023-11-30 ENCOUNTER — Encounter: Payer: Self-pay | Admitting: Family Medicine

## 2023-11-30 ENCOUNTER — Other Ambulatory Visit: Payer: Self-pay | Admitting: Family Medicine

## 2023-11-30 MED ORDER — GLIPIZIDE 10 MG PO TABS: 10.0000 mg | ORAL_TABLET | Freq: Two times a day (BID) | ORAL | 3 refills | Status: DC

## 2023-11-30 MED ORDER — ROSUVASTATIN CALCIUM 10 MG PO TABS: 10.0000 mg | ORAL_TABLET | Freq: Every day | ORAL | 3 refills | Status: DC

## 2023-11-30 MED ORDER — OZEMPIC (0.25 OR 0.5 MG/DOSE) 2 MG/3ML ~~LOC~~ SOPN: 0.2500 mg | PEN_INJECTOR | SUBCUTANEOUS | 0 refills | Status: DC

## 2023-12-02 ENCOUNTER — Other Ambulatory Visit (HOSPITAL_COMMUNITY): Payer: Self-pay

## 2023-12-02 ENCOUNTER — Encounter (INDEPENDENT_AMBULATORY_CARE_PROVIDER_SITE_OTHER): Payer: Self-pay | Admitting: *Deleted

## 2023-12-02 ENCOUNTER — Telehealth: Payer: Self-pay | Admitting: Pharmacy Technician

## 2023-12-02 NOTE — Telephone Encounter (Signed)
 PA request has been Received. New Encounter has been or will be created for follow up. For additional info see Pharmacy Prior Auth telephone encounter from 12/02/2023.

## 2023-12-02 NOTE — Telephone Encounter (Signed)
 Pharmacy Patient Advocate Encounter   Received notification from Patient Advice Request messages that prior authorization for [facility] 0.25MG  OR 0.5MG /DOSE is required/requested.   Insurance verification completed.   The patient is insured through [employer] .   Per test claim: Refill too soon. PA is not needed at this time. Medication was filled 11/30/2023. Next eligible fill date is 01/17/2024.

## 2023-12-12 ENCOUNTER — Encounter: Payer: Self-pay | Admitting: Family Medicine

## 2023-12-16 ENCOUNTER — Encounter (HOSPITAL_COMMUNITY): Payer: Self-pay

## 2023-12-16 ENCOUNTER — Ambulatory Visit (HOSPITAL_COMMUNITY)
Admission: RE | Admit: 2023-12-16 | Discharge: 2023-12-16 | Disposition: A | Discharge status: Home / Self Care | Source: Ambulatory Visit | Attending: Family Medicine | Admitting: Family Medicine

## 2023-12-16 DIAGNOSIS — Z1231 Encounter for screening mammogram for malignant neoplasm of breast: Secondary | ICD-10-CM | POA: Insufficient documentation

## 2023-12-17 ENCOUNTER — Other Ambulatory Visit: Payer: Self-pay | Admitting: Family Medicine

## 2024-01-03 ENCOUNTER — Ambulatory Visit (INDEPENDENT_AMBULATORY_CARE_PROVIDER_SITE_OTHER): Admitting: Neurology

## 2024-01-03 ENCOUNTER — Encounter: Payer: Self-pay | Admitting: Neurology

## 2024-01-03 VITALS — BP 127/80 | HR 68 | Ht 63.0 in | Wt 221.6 lb

## 2024-01-03 DIAGNOSIS — Z8669 Personal history of other diseases of the nervous system and sense organs: Secondary | ICD-10-CM | POA: Diagnosis not present

## 2024-01-03 DIAGNOSIS — G4719 Other hypersomnia: Secondary | ICD-10-CM | POA: Diagnosis not present

## 2024-01-03 DIAGNOSIS — E669 Obesity, unspecified: Secondary | ICD-10-CM

## 2024-01-03 DIAGNOSIS — F17200 Nicotine dependence, unspecified, uncomplicated: Secondary | ICD-10-CM | POA: Diagnosis not present

## 2024-01-03 DIAGNOSIS — R635 Abnormal weight gain: Secondary | ICD-10-CM

## 2024-01-03 NOTE — Patient Instructions (Signed)

## 2024-01-03 NOTE — Progress Notes (Signed)
 Subjective:    Patient ID: Vanessa Shelton is a 48 y.o. female.  HPI    Vanessa Fairy, MD, PhD [facility] [address], [address] [address] [place], [place] [address]  Dear Rogerio Clay,  I saw your patient, Vanessa Shelton, upon your kind request in my sleep clinic today for initial consultation of her sleep disorder, in particular, concern for underlying obstructive sleep apnea.  The patient is unaccompanied today.  As you know, [name] is a 48 year old female with an underlying medical history asthma as a child, diabetes, reflux disease, hypertension, hyperlipidemia, smoking, vitamin D  deficiency, anxiety, depression, and obesity, who reports snoring and excessive daytime somnolence, as well as witnessed apneas.  Her Epworth sleepiness score is 9 out of 24, fatigue severity score is 38 out of 63. I reviewed your office note from 11/25/2023.  She had a sleep study several years ago.  I was able to review her sleep study report from 09/30/2009.  Study was interpreted by Dr. Jeneen Mire.  She had a sleep efficiency of 75%, sleep latency 22 minutes, REM latency 73 minutes.  REM percentage was 25.2%.  Her AHI was 16.5/h, O2 nadir 89%.  Her weight was 217 at the time with a BMI of 36. Neck size was 16 in.    She reports that she has not used her PAP machine in years, probably over 5 years, due to lack of supplies and difficulty with the seal of the mask. She would be willing to get re-evaluated and consider PAP therapy again. She is working on weight loss and started Ozempic  about one month ago. She brought her old PAP machine, an older ResMed model, but a compliance download was not possible.  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  She tries to be in bed before 9 PM.  She lives with her husband and 59 year old son, she also has 3 grown children.  She has 1 small dog in the household, the dog does not typically sleep in the bedroom with them.  She does not have a TV  in her bedroom.  She denies nightly nocturia or recurrent morning headaches.  She is not aware of any family history of sleep apnea.  She drinks caffeine not daily, no alcohol currently, smokes about a pack per day and is not actively working on smoking cessation currently.      Her Past Medical History Is Significant For: Past Medical History:  Diagnosis Date   Anxiety    Asthma    Back pain    Chest pain    Constipation    Depression    Diabetes mellitus without complication (HCC)    GERD (gastroesophageal reflux disease)    High blood pressure    High cholesterol    Joint pain    Sleep apnea    Swelling of lower extremity    Tobacco abuse    Vitamin D  deficiency     Her Past Surgical History Is Significant For: Past Surgical History:  Procedure Laterality Date   APPENDECTOMY     CHOLECYSTECTOMY     TUBAL LIGATION      Her Family History Is Significant For: Family History  Problem Relation Age of Onset   Heart defect Mother    Hypertension Mother    High Cholesterol Mother    Depression Mother    Anxiety disorder Mother    Diabetes Father    High Cholesterol Father  Sleep apnea Father    Hypertension Maternal Grandmother    Heart attack Maternal Grandfather    Breast cancer Other     Her Social History Is Significant For: Social History   Socioeconomic History   Marital status: Married    Spouse name: Not on file   Number of children: Not on file   Years of education: Not on file   Highest education level: Some college, no degree  Occupational History   Not on file  Tobacco Use   Smoking status: Every Day    Current packs/day: 1.00    Types: Cigarettes   Smokeless tobacco: Never  Substance and Sexual Activity   Alcohol use: No   Drug use: No   Sexual activity: Yes    Birth control/protection: Surgical  Other Topics Concern   Not on file  Social History Narrative   Not on file   Social Drivers of Health   Financial Resource Strain: Low  Risk  (11/15/2023)   Overall Financial Resource Strain (CARDIA)    Difficulty of Paying Living Expenses: Not very hard  Food Insecurity: No Food Insecurity (11/15/2023)   Hunger Vital Sign    Worried About Running Out of Food in the Last Year: Never true    Ran Out of Food in the Last Year: Never true  Transportation Needs: No Transportation Needs (11/15/2023)   PRAPARE - Administrator, Civil Service (Medical): No    Lack of Transportation (Non-Medical): No  Physical Activity: Insufficiently Active (11/15/2023)   Exercise Vital Sign    Days of Exercise per Week: 3 days    Minutes of Exercise per Session: 30 min  Stress: Stress Concern Present (11/15/2023)   [employer] of Occupational Health - Occupational Stress Questionnaire    Feeling of Stress : Very much  Social Connections: Moderately Isolated (11/15/2023)   Social Connection and Isolation Panel [NHANES]    Frequency of Communication with Friends and Family: Twice a week    Frequency of Social Gatherings with Friends and Family: Once a week    Attends Religious Services: Never    Database administrator or Organizations: No    Attends Engineer, structural: Not on file    Marital Status: Married    Her Allergies Are:  Allergies  Allergen Reactions   Penicillins Hives and Other (See Comments)   Bee Venom Swelling   Latex Itching and Rash  :   Her Current Medications Are:  Outpatient Encounter Medications as of 01/03/2024  Medication Sig   Blood Glucose Monitoring Suppl DEVI 1 each by Does not apply route in the morning, at noon, and at bedtime. May substitute to any manufacturer covered by patient's insurance.   buPROPion  (WELLBUTRIN  XL) 300 MG 24 hr tablet TAKE 1 TABLET BY MOUTH EVERY DAY   cyclobenzaprine  (FLEXERIL ) 5 MG tablet Take 1 tablet (5 mg total) by mouth 3 (three) times daily as needed for muscle spasms.   gabapentin  (NEURONTIN ) 300 MG capsule Take 1 capsule (300 mg total) by mouth 2 (two)  times daily.   glipiZIDE  (GLUCOTROL ) 10 MG tablet Take 1 tablet (10 mg total) by mouth 2 (two) times daily before a meal.   hydrOXYzine  (VISTARIL ) 25 MG capsule Take 1 capsule (25 mg total) by mouth every 8 (eight) hours as needed.   Lancets (FREESTYLE) lancets USE TO TEST BLOOD SUGAR, AM, NOON & BEDTIME, 3 TIMES A DAY   metFORMIN  (GLUCOPHAGE ) 500 MG tablet Take 1 tablet (500 mg  total) by mouth daily with breakfast.   PARoxetine  (PAXIL ) 20 MG tablet TAKE 1 TABLET BY MOUTH EVERY DAY   rosuvastatin  (CRESTOR ) 10 MG tablet Take 1 tablet (10 mg total) by mouth daily.   Semaglutide ,0.25 or 0.5MG /DOS, (OZEMPIC , 0.25 OR 0.5 MG/DOSE,) 2 MG/3ML SOPN Inject 0.25 mg into the skin once a week.   chlorhexidine  (PERIDEX ) 0.12 % solution Use as directed 15 mLs in the mouth or throat 2 (two) times daily. (Patient not taking: Reported on 01/03/2024)   clindamycin  (CLEOCIN ) 300 MG capsule Take 1 capsule (300 mg total) by mouth 2 (two) times daily. (Patient not taking: Reported on 11/25/2023)   fluticasone  (FLONASE  ALLERGY RELIEF) 50 MCG/ACT nasal spray Place 2 sprays into both nostrils daily.   lidocaine  (XYLOCAINE ) 2 % solution Use as directed 10 mLs in the mouth or throat every 3 (three) hours as needed.   No facility-administered encounter medications on file as of 01/03/2024.  :   Review of Systems:  Out of a complete 14 point review of systems, all are reviewed and negative with the exception of these symptoms as listed below:  Review of Systems  Neurological:        Room 9 Pt is here Alone. Pt states that she hasn't used her machine in forever due to her mask losing it's seal and pt couldn't afford to replace it. ESS 9 FSS 38    Objective:  Neurological Exam  Physical Exam Physical Examination:   Vitals:   01/03/24 0900  BP: 127/80  Pulse: 68    General Examination: The patient is a very pleasant 48 y.o. female in no acute distress. She appears well-developed and well-nourished and well  groomed.   HEENT: Normocephalic, atraumatic, pupils are equal, round and reactive to light, extraocular tracking is good without limitation to gaze excursion or nystagmus noted. Hearing is grossly intact. Face is symmetric with normal facial animation. Speech is clear with no dysarthria noted. There is no hypophonia. There is no lip, neck/head, jaw or voice tremor. Neck is supple with full range of passive and active motion. There are no carotid bruits on auscultation. Oropharynx exam reveals: mild mouth dryness, marginal dental hygiene and moderate to marked airway crowding, due to smaller airway entry, thicker soft palate, larger uvula, thicker tongue, tonsils 1-2+ bilaterally. Mallampati is class III. Tongue protrudes centrally and palate elevates symmetrically.   Chest: Clear to auscultation without wheezing, rhonchi or crackles noted.  Heart: S1+S2+0, regular and normal without murmurs, rubs or gallops noted.   Abdomen: Soft, non-tender and non-distended.  Extremities: There is no pitting edema in the distal lower extremities bilaterally.   Skin: Warm and dry without trophic changes noted.   Musculoskeletal: exam reveals no obvious joint deformities.   Neurologically:  Mental status: The patient is awake, alert and oriented in all 4 spheres. Her immediate and remote memory, attention, language skills and fund of knowledge are appropriate. There is no evidence of aphasia, agnosia, apraxia or anomia. Speech is clear with normal prosody and enunciation. Thought process is linear. Mood is normal and affect is normal.  Cranial nerves II - XII are as described above under HEENT exam.  Motor exam: Normal bulk, strength and tone is noted. There is no obvious action or resting tremor.  Fine motor skills and coordination: grossly intact.  Cerebellar testing: No dysmetria or intention tremor. There is no truncal or gait ataxia.  Sensory exam: intact to light touch in the upper and lower extremities.   Gait, station  and balance: She stands easily. No veering to one side is noted. No leaning to one side is noted. Posture is age-appropriate and stance is narrow based. Gait shows normal stride length and normal pace. No problems turning are noted.   Assessment and plan:   In summary, Ahtziry K Disano is a very pleasant 48 year old female with an underlying medical history asthma as a child, diabetes, reflux disease, hypertension, hyperlipidemia, smoking, vitamin D  deficiency, anxiety, depression, and obesity, who presents for evaluation of her obstructive sleep apnea.  She was diagnosed with moderate obstructive sleep apnea in 2011 but has not been on PAP therapy for at least the past 5 years.  She would be willing to get reevaluated and restart treatment with PAP therapy.  I had a long chat with the patient about my findings and the diagnosis of sleep apnea, particularly OSA, its prognosis and treatment options. We talked about medical/conservative treatments, surgical interventions and non-pharmacological approaches for symptom control. I explained, in particular, the risks and ramifications of untreated moderate to severe OSA, especially with respect to developing cardiovascular disease down the road, including congestive heart failure (CHF), difficult to treat hypertension, cardiac arrhythmias (particularly A-fib), neurovascular complications including TIA, stroke and dementia. Even type 2 diabetes has, in part, been linked to untreated OSA. Symptoms of untreated OSA may include (but may not be limited to) daytime sleepiness, nocturia (i.e. frequent nighttime urination), memory problems, mood irritability and suboptimally controlled or worsening mood disorder such as depression and/or anxiety, lack of energy, lack of motivation, physical discomfort, as well as recurrent headaches, especially morning or nocturnal headaches. We talked about the importance of maintaining a healthy lifestyle and striving for  healthy weight.  The importance of complete smoking cessation was also addressed.  In addition, we talked about the importance of striving for and maintaining good sleep hygiene. I recommended a sleep study at this time. I outlined the differences between a laboratory attended sleep study which is considered more comprehensive and accurate over the option of a home sleep test (HST); the latter may lead to underestimation of sleep disordered breathing in some instances and does not help with diagnosing upper airway resistance syndrome and is not accurate enough to diagnose primary central sleep apnea typically. I outlined possible surgical and non-surgical treatment options of OSA, including the use of a positive airway pressure (PAP) device (i.e. CPAP, AutoPAP/APAP or BiPAP in certain circumstances), a custom-made dental device (aka oral appliance, which would require a referral to a specialist dentist or orthodontist typically, and is generally speaking not considered for patients with full dentures or edentulous state), upper airway surgical options, such as traditional UPPP (which is not considered a first-line treatment) or the Inspire device (hypoglossal nerve stimulator, which would involve a referral for consultation with an ENT surgeon, after careful selection, following inclusion criteria - also not first-line treatment). I explained the PAP treatment option to the patient in detail, as this is generally considered first-line treatment.  The patient indicated that she would be willing to try PAP therapy again, if the need arises. I explained the importance of being compliant with PAP treatment, not only for insurance purposes but primarily to improve patient's symptoms symptoms, and for the patient's long term health benefit, including to reduce Her cardiovascular risks longer-term.    We will pick up our discussion about the next steps and treatment options after testing.  We will keep her posted as to  the test results by phone call and/or MyChart messaging  where possible.  We will plan to follow-up in sleep clinic accordingly as well.  I answered all her questions today and the patient was in agreement.   I encouraged her to call with any interim questions, concerns, problems or updates or email us  through MyChart.  Generally speaking, sleep test authorizations may take up to 2 weeks, sometimes less, sometimes longer, the patient is encouraged to get in touch with us  if they do not hear back from the sleep lab staff directly within the next 2 weeks.  Thank you very much for allowing me to participate in the care of this nice patient. If I can be of any further assistance to you please do not hesitate to call me at [phone].  Sincerely,   Vanessa Fairy, MD, PhD

## 2024-01-12 ENCOUNTER — Other Ambulatory Visit: Payer: Self-pay | Admitting: Family Medicine

## 2024-01-12 MED ORDER — OZEMPIC (0.25 OR 0.5 MG/DOSE) 2 MG/3ML ~~LOC~~ SOPN: 0.5000 mg | PEN_INJECTOR | SUBCUTANEOUS | 0 refills | Status: DC

## 2024-01-13 ENCOUNTER — Ambulatory Visit (INDEPENDENT_AMBULATORY_CARE_PROVIDER_SITE_OTHER): Admitting: Neurology

## 2024-01-13 DIAGNOSIS — G4733 Obstructive sleep apnea (adult) (pediatric): Secondary | ICD-10-CM

## 2024-01-13 DIAGNOSIS — F17200 Nicotine dependence, unspecified, uncomplicated: Secondary | ICD-10-CM

## 2024-01-13 DIAGNOSIS — G4719 Other hypersomnia: Secondary | ICD-10-CM

## 2024-01-13 DIAGNOSIS — R635 Abnormal weight gain: Secondary | ICD-10-CM

## 2024-01-13 DIAGNOSIS — E669 Obesity, unspecified: Secondary | ICD-10-CM

## 2024-01-13 DIAGNOSIS — Z8669 Personal history of other diseases of the nervous system and sense organs: Secondary | ICD-10-CM

## 2024-01-22 ENCOUNTER — Other Ambulatory Visit: Payer: Self-pay | Admitting: Family Medicine

## 2024-01-22 DIAGNOSIS — E119 Type 2 diabetes mellitus without complications: Secondary | ICD-10-CM

## 2024-02-02 NOTE — Progress Notes (Signed)
 See procedure note.

## 2024-02-07 ENCOUNTER — Ambulatory Visit: Payer: Self-pay | Admitting: Neurology

## 2024-02-07 DIAGNOSIS — G4733 Obstructive sleep apnea (adult) (pediatric): Secondary | ICD-10-CM

## 2024-02-07 NOTE — Procedures (Signed)
 [facility]  HOME SLEEP TEST (SANSA) REPORT (Mail-Out Device):   STUDY DATE: 01/18/24  DOB: Jul 23, 1976  MRN: [id number]  ORDERING CLINICIAN: [name], MD, PhD   REFERRING CLINICIAN: Del Amber Bail, Rogerio Clay, FNP   CLINICAL INFORMATION/HISTORY: 48 year old female with an underlying medical history asthma as a child, diabetes, reflux disease, hypertension, hyperlipidemia, smoking, vitamin D  deficiency, anxiety, depression, and obesity, who reports snoring and excessive daytime somnolence, as well as witnessed apneas. She was previously diagnosed with OSA, but has not used a PAP machine in years.   PATIENT'S LAST REPORTED EPWORTH SLEEPINESS SCORE (ESS): 9/24.  BMI (at the time of sleep clinic visit and/or test date): 39.3 kg/m  FINDINGS:   Study Protocol:    The SANSA single-point-of-skin-contact chest-worn sensor - an FDA cleared and DOT approved type 4 home sleep test device - measures eight physiological channels,  including blood oxygen saturation (measured via PPG [photoplethysmography]), EKG-derived heart rate, respiratory effort, chest movement (measured via accelerometer), snoring, body position, and actigraphy. The device is designed to be worn for up to 10 hours per study.   Sleep Summary:   Total Recording Time (hours, min): 9 hours, 57 min  Total Effective Sleep Time (hours, min):  8 hours, 6 min  Sleep Efficiency (%):    83%   Respiratory Indices:   Calculated sAHI (per hour):  20.8/hour         Oxygen Saturation Statistics:    Oxygen Saturation (%) Mean: 94%   Minimum oxygen saturation (%):                 77.7%   O2 Saturation Range (%): 77.7 -98.8%   Time below or at 88% saturation: 3 min   Pulse Rate Statistics:   Pulse Mean (bpm):    69/min    Pulse Range (44 - 102/min)   Snoring: Mild to loud  IMPRESSION/DIAGNOSES:   OSA (obstructive sleep apnea), moderate   RECOMMENDATIONS:   This home sleep test demonstrates  moderate obstructive sleep apnea with a total AHI of 20.8/hour and O2 nadir of 77.7%.  Variable snoring was detected, ranging from mild to louder. Treatment with a positive airway pressure (PAP) device is recommended. The patient will be advised to proceed with an autoPAP titration/trial at home for now. A full night titration study may be considered to optimize treatment settings, monitor proper oxygen saturations and aid with improvement of tolerance and adherence, if needed down the road. Alternative treatment options may include a dental device through dentistry or orthodontics in selected patients or Inspire (hypoglossal nerve stimulator) in carefully selected patients (meeting inclusion criteria).  Concomitant weight loss is recommended (where clinically appropriate). Please note that untreated obstructive sleep apnea may carry additional perioperative morbidity. Patients with significant obstructive sleep apnea should receive perioperative PAP therapy and the surgeons and particularly the anesthesiologist should be informed of the diagnosis and the severity of the sleep disordered breathing. The patient should be cautioned not to drive, work at heights, or operate dangerous or heavy equipment when tired or sleepy. Review and reiteration of good sleep hygiene measures should be pursued with any patient. Other causes of the patient's symptoms, including circadian rhythm disturbances, an underlying mood disorder, medication effect and/or an underlying medical problem cannot be ruled out based on this test. Clinical correlation is recommended.  The patient and her referring provider will be notified of the test results. The patient will be seen in follow up in sleep clinic at [facility].  I certify  that I have reviewed the raw data recording prior to the issuance of this report in accordance with the standards of the American Academy of Sleep Medicine (AASM).    INTERPRETING PHYSICIAN:   [name], MD,  PhD Medical Director, [facility]) Diplomat, ABPN (Neurology and Sleep)   [facility] [address], [address] [place], [place] [address] [phone]

## 2024-02-14 NOTE — Telephone Encounter (Signed)
 Took call from patient. We discussed her sleep study results as noted below by Dr Omar Bibber.  The patient verbalized understanding and agreed to AutoPap set up.  We discussed the insurance compliance requirements which includes using the machine at least 4 hours at night (goal is all night) and also being seen by our office between 30 and 90 days after set up.  Patient was scheduled for 05/10/24 at 12:45 pm arrive at 12:30 pm. She would like to be setup in [place] at [facility] if able, second choice would be in [place]. I called [facility]. They take pt's insurance and have machines in stock. I sent pt a mychart message to let her know the referral is being faxed to [facility].   Received a receipt of confirmation for fax. Also sent sleep study results to referring provider.

## 2024-02-16 ENCOUNTER — Other Ambulatory Visit: Payer: Self-pay | Admitting: Family Medicine

## 2024-02-16 MED ORDER — SEMAGLUTIDE (1 MG/DOSE) 4 MG/3ML ~~LOC~~ SOPN: 1.0000 mg | PEN_INJECTOR | SUBCUTANEOUS | 0 refills | Status: DC

## 2024-02-22 NOTE — Telephone Encounter (Signed)
 At patient's request, I called [facility] and canceled the referral for cpap. I sent the order over to [facility].

## 2024-02-23 NOTE — Telephone Encounter (Signed)
 New, Maryella Shivers, Otilio Jefferson, RN; Alain Honey; Jeris Penta, [place]; 1 other Received, thank you!

## 2024-03-13 ENCOUNTER — Other Ambulatory Visit: Payer: Self-pay | Admitting: Family Medicine

## 2024-03-13 MED ORDER — SEMAGLUTIDE (2 MG/DOSE) 8 MG/3ML ~~LOC~~ SOPN: 2.0000 mg | PEN_INJECTOR | SUBCUTANEOUS | 2 refills | Status: DC

## 2024-03-15 DIAGNOSIS — G4733 Obstructive sleep apnea (adult) (pediatric): Secondary | ICD-10-CM | POA: Diagnosis not present

## 2024-03-30 ENCOUNTER — Ambulatory Visit: Admitting: Family Medicine

## 2024-03-30 ENCOUNTER — Encounter: Payer: Self-pay | Admitting: Family Medicine

## 2024-03-30 VITALS — BP 122/78 | HR 70 | Ht 63.0 in | Wt 218.0 lb

## 2024-03-30 DIAGNOSIS — Z1211 Encounter for screening for malignant neoplasm of colon: Secondary | ICD-10-CM | POA: Diagnosis not present

## 2024-03-30 DIAGNOSIS — Z7984 Long term (current) use of oral hypoglycemic drugs: Secondary | ICD-10-CM

## 2024-03-30 DIAGNOSIS — E782 Mixed hyperlipidemia: Secondary | ICD-10-CM | POA: Diagnosis not present

## 2024-03-30 DIAGNOSIS — E1169 Type 2 diabetes mellitus with other specified complication: Secondary | ICD-10-CM

## 2024-03-30 MED ORDER — SEMAGLUTIDE (2 MG/DOSE) 8 MG/3ML ~~LOC~~ SOPN
2.0000 mg | PEN_INJECTOR | SUBCUTANEOUS | 2 refills | Status: DC
  Filled 2024-06-24: qty 3, 28d supply, fill #0

## 2024-03-30 NOTE — Progress Notes (Signed)
 Established Patient Office Visit   Subjective  Patient ID: Vanessa Shelton, female    DOB: December 27, 1975  Age: 48 y.o. MRN: [id number]  Chief Complaint  Patient presents with   Diabetes    Four month follow up    She  has a past medical history of Anxiety, Asthma, Back pain, Chest pain, Constipation, Depression, Diabetes mellitus without complication (HCC), GERD (gastroesophageal reflux disease), High blood pressure, High cholesterol, Joint pain, Sleep apnea, Swelling of lower extremity, Tobacco abuse, and Vitamin D  deficiency.  HPI Patient presents to the clinic for chronic follow up. For the details of today's visit, please refer to assessment and plan.  Review of Systems  Constitutional:  Negative for chills and fever.  Respiratory:  Negative for shortness of breath.   Cardiovascular:  Negative for chest pain.  Neurological:  Negative for dizziness and headaches.      Objective:     BP 122/78   Pulse 70   Ht 5' 3 (1.6 m)   Wt 218 lb (98.9 kg)   SpO2 96%   BMI 38.62 kg/m  BP Readings from Last 3 Encounters:  03/30/24 122/78  01/03/24 127/80  11/25/23 120/68      Physical Exam Vitals reviewed.  Constitutional:      General: She is not in acute distress.    Appearance: Normal appearance. She is not ill-appearing, toxic-appearing or diaphoretic.  HENT:     Head: Normocephalic.  Eyes:     General:        Right eye: No discharge.        Left eye: No discharge.     Conjunctiva/sclera: Conjunctivae normal.  Cardiovascular:     Rate and Rhythm: Normal rate.     Pulses: Normal pulses.     Heart sounds: Normal heart sounds.  Pulmonary:     Effort: Pulmonary effort is normal. No respiratory distress.     Breath sounds: Normal breath sounds.  Abdominal:     General: Bowel sounds are normal.     Palpations: Abdomen is soft.     Tenderness: There is no abdominal tenderness. There is no right CVA tenderness, left CVA tenderness or guarding.  Skin:    General: Skin is  warm and dry.  Neurological:     Mental Status: She is alert.  Psychiatric:        Mood and Affect: Mood normal.        Behavior: Behavior normal.      No results found for any visits on 03/30/24.  The 10-year ASCVD risk score (Arnett DK, et al., 2019) is: 6.9%    Assessment & Plan:  Type 2 diabetes mellitus with other specified complication, without long-term current use of insulin  (HCC) Assessment & Plan: Last Hemoglobin A1c: 10.6 Labs: Ordered today, results pending; will follow up accordingly. The patient reports adhering to prescribed medications: Ozempic  0.25 mg weekly injections Glipizide  10 mg twice daily, Metformin  500 mg once daily  Reviewed non-pharmacological interventions, including a balanced diet rich in lean proteins, healthy fats, whole grains, and high-fiber vegetables. Emphasized reducing refined sugars and processed carbohydrates, and incorporating more fruits, leafy greens, and legumes. Education: Patient was educated on recognizing signs and symptoms of both hypoglycemia and hyperglycemia, and advised to seek emergency care if these symptoms occur. Follow-Up: Scheduled for follow-up in 3-4 months, or sooner if needed. Patient Understanding: The patient verbalized understanding of the care plan, and all questions were answered. Additional Care: Ophthalmology referral was placed. Foot exam results  were within normal limits.   Orders: -     Hemoglobin A1c -     Ambulatory referral to Ophthalmology  Screen for colon cancer -     Cologuard  Mixed hyperlipidemia -     BMP8+eGFR -     Lipid panel -     CBC with Differential/Platelet  Other orders -     Semaglutide  (2 MG/DOSE); Inject 2 mg as directed once a week.  Dispense: 3 mL; Refill: 2    Return in about 4 months (around 07/31/2024), or if symptoms worsen or fail to improve, for type 2 diabetes, hyperlipidemia.   Hilario Kidd Wilhelmena Falter, FNP

## 2024-03-30 NOTE — Patient Instructions (Signed)

## 2024-03-30 NOTE — Assessment & Plan Note (Signed)
 Last Hemoglobin A1c: 10.6 Labs: Ordered today, results pending; will follow up accordingly. The patient reports adhering to prescribed medications: Ozempic  0.25 mg weekly injections Glipizide  10 mg twice daily, Metformin  500 mg once daily  Reviewed non-pharmacological interventions, including a balanced diet rich in lean proteins, healthy fats, whole grains, and high-fiber vegetables. Emphasized reducing refined sugars and processed carbohydrates, and incorporating more fruits, leafy greens, and legumes. Education: Patient was educated on recognizing signs and symptoms of both hypoglycemia and hyperglycemia, and advised to seek emergency care if these symptoms occur. Follow-Up: Scheduled for follow-up in 3-4 months, or sooner if needed. Patient Understanding: The patient verbalized understanding of the care plan, and all questions were answered. Additional Care: Ophthalmology referral was placed. Foot exam results were within normal limits.

## 2024-03-31 LAB — CBC WITH DIFFERENTIAL/PLATELET
Basophils Absolute: 0.1 x10E3/uL (ref 0.0–0.2)
Basos: 1 %
EOS (ABSOLUTE): 0.6 x10E3/uL — ABNORMAL HIGH (ref 0.0–0.4)
Eos: 7 %
Hematocrit: 39.6 % (ref 34.0–46.6)
Hemoglobin: 13 g/dL (ref 11.1–15.9)
Immature Grans (Abs): 0 x10E3/uL (ref 0.0–0.1)
Immature Granulocytes: 0 %
Lymphocytes Absolute: 2.2 x10E3/uL (ref 0.7–3.1)
Lymphs: 27 %
MCH: 31.6 pg (ref 26.6–33.0)
MCHC: 32.8 g/dL (ref 31.5–35.7)
MCV: 96 fL (ref 79–97)
Monocytes Absolute: 0.6 x10E3/uL (ref 0.1–0.9)
Monocytes: 7 %
Neutrophils Absolute: 4.7 x10E3/uL (ref 1.4–7.0)
Neutrophils: 58 %
Platelets: 357 x10E3/uL (ref 150–450)
RBC: 4.11 x10E6/uL (ref 3.77–5.28)
RDW: 12 % (ref 11.7–15.4)
WBC: 8.1 x10E3/uL (ref 3.4–10.8)

## 2024-03-31 LAB — BMP8+EGFR
BUN/Creatinine Ratio: 15 (ref 9–23)
BUN: 9 mg/dL (ref 6–24)
CO2: 18 mmol/L — ABNORMAL LOW (ref 20–29)
Calcium: 9 mg/dL (ref 8.7–10.2)
Chloride: 105 mmol/L (ref 96–106)
Creatinine, Ser: 0.62 mg/dL (ref 0.57–1.00)
Glucose: 104 mg/dL — ABNORMAL HIGH (ref 70–99)
Potassium: 4 mmol/L (ref 3.5–5.2)
Sodium: 139 mmol/L (ref 134–144)
eGFR: 112 mL/min/1.73 (ref >59)

## 2024-03-31 LAB — LIPID PANEL
Chol/HDL Ratio: 4.3 ratio (ref 0.0–4.4)
Cholesterol, Total: 170 mg/dL (ref 100–199)
HDL: 40 mg/dL (ref >39)
LDL Chol Calc (NIH): 112 mg/dL — ABNORMAL HIGH (ref 0–99)
Triglycerides: 100 mg/dL (ref 0–149)
VLDL Cholesterol Cal: 18 mg/dL (ref 5–40)

## 2024-03-31 LAB — HEMOGLOBIN A1C
Est. average glucose Bld gHb Est-mCnc: 143 mg/dL
Hgb A1c MFr Bld: 6.6 % — ABNORMAL HIGH (ref 4.8–5.6)

## 2024-04-04 ENCOUNTER — Ambulatory Visit: Payer: Self-pay | Admitting: Family Medicine

## 2024-04-04 ENCOUNTER — Other Ambulatory Visit: Payer: Self-pay | Admitting: Family Medicine

## 2024-04-04 MED ORDER — ROSUVASTATIN CALCIUM 10 MG PO TABS: 10.0000 mg | ORAL_TABLET | Freq: Every day | ORAL | 3 refills | Status: AC

## 2024-04-15 DIAGNOSIS — G4733 Obstructive sleep apnea (adult) (pediatric): Secondary | ICD-10-CM | POA: Diagnosis not present

## 2024-04-27 ENCOUNTER — Other Ambulatory Visit: Payer: Self-pay | Admitting: Family Medicine

## 2024-05-01 ENCOUNTER — Other Ambulatory Visit: Payer: Self-pay | Admitting: Family Medicine

## 2024-05-01 ENCOUNTER — Encounter: Payer: Self-pay | Admitting: Family Medicine

## 2024-05-01 MED ORDER — DEXCOM G7 SENSOR MISC: 1 refills | Status: DC

## 2024-05-01 NOTE — Telephone Encounter (Signed)
 sent

## 2024-05-02 ENCOUNTER — Telehealth: Payer: Self-pay | Admitting: Pharmacy Technician

## 2024-05-02 ENCOUNTER — Other Ambulatory Visit (HOSPITAL_COMMUNITY): Payer: Self-pay

## 2024-05-02 NOTE — Telephone Encounter (Signed)
 Pharmacy Patient Advocate Encounter   Received notification from Onbase that prior authorization for Dexcom G7 Sensor is required/requested.   Insurance verification completed.   The patient is insured through [employer] .   Per test claim: PA required; PA submitted to above mentioned insurance via Latent Key/confirmation #/EOC [id number] Status is pending

## 2024-05-09 NOTE — Progress Notes (Unsigned)
 Vanessa Shelton

## 2024-05-10 ENCOUNTER — Telehealth: Payer: Self-pay

## 2024-05-10 ENCOUNTER — Ambulatory Visit (INDEPENDENT_AMBULATORY_CARE_PROVIDER_SITE_OTHER): Admitting: Neurology

## 2024-05-10 ENCOUNTER — Ambulatory Visit: Admitting: Neurology

## 2024-05-10 VITALS — BP 135/79 | HR 86 | Temp 98.9°F | Wt 217.8 lb

## 2024-05-10 DIAGNOSIS — G4733 Obstructive sleep apnea (adult) (pediatric): Secondary | ICD-10-CM | POA: Diagnosis not present

## 2024-05-10 NOTE — Telephone Encounter (Signed)
-----   Message from Lauraine JINNY Born sent at 05/10/2024  1:00 PM EDT ----- I already changed pressure range and ResMed, but ordered a mask refit can you send over.  Thanks

## 2024-05-10 NOTE — Telephone Encounter (Signed)
 Cpap order to dme DME [facility]  [phone]

## 2024-05-10 NOTE — Progress Notes (Signed)
 Patient: Vanessa Shelton Date of Birth: August 22, 1976  Reason for Visit: Follow up History from: Patient Primary Neurologist: Buck  ASSESSMENT AND PLAN 48 y.o. year old female   1.  OSA on CPAP (set up 03/15/2024.  HST 01/18/2024 showed moderate OSA)  Currently having difficulty tolerating the CPAP machine.  Her current pressure is 6 to 12 cm water.  She is staying on the higher end at 11.9 and maxing out at 12, AHI 13.6.  I will increase her pressure range 6 to 15 cm water.  EPR is already at 2.  I will order a mask refit, she may consider FFM over the nose or nasal pillow with chin strap.  I will pull a download in about 1 month.  She is motivated to continue CPAP use and desires to have nightly compliance for minimum 4 hours.  We will plan to follow-up in 6 months virtually  HISTORY OF PRESENT ILLNESS: Today 05/10/24 Saw Dr. Buck April 2025 for sleep consult reporting snoring and excessive daytime somnolence as well as witnessed apneas.  HST 01/18/2024 showed  moderate obstructive sleep apnea with a total AHI of 20.8/hour and O2 nadir of 77.7%.  CPAP set up 03/15/2024.  Recent download shows 40% usage, greater than 4 hours 10%.  In the initial compliance, she had 100% nightly usage 70% usage greater than 4 hours.  6-12 cmH2O.  Leak was 22.  AHI 13.6 (9 of those were obstructive). Using an extra small FFM under her nose. Started out doing well the 1st month, then felt like she smothering.  Stopped using her CPAP machine consistently.  She is highly motivated to use CPAP.  She is not sleeping well, feels fatigued during the day.  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   HISTORY  01/03/24 Dr. Buck: I saw your patient, Vanessa Shelton, upon your kind request in my sleep clinic today for initial consultation of her sleep disorder, in particular, concern for underlying obstructive sleep apnea.  The patient is unaccompanied today.  As you know, Vanessa Shelton is a 48 year old female with an  underlying medical history asthma as a child, diabetes, reflux disease, hypertension, hyperlipidemia, smoking, vitamin D  deficiency, anxiety, depression, and obesity, who reports snoring and excessive daytime somnolence, as well as witnessed apneas.  Her Epworth sleepiness score is 9 out of 24, fatigue severity score is 38 out of 63. I reviewed your office note from 11/25/2023.  She had a sleep study several years ago.  I was able to review her sleep study report from 09/30/2009.  Study was interpreted by Dr. Havery Rumble.  She had a sleep efficiency of 75%, sleep latency 22 minutes, REM latency 73 minutes.  REM percentage was 25.2%.  Her AHI was 16.5/h, O2 nadir 89%.  Her weight was 217 at the time with a BMI of 36. Neck size was 16 in.     She reports that she has not used her PAP machine in years, probably over 5 years, due to lack of supplies and difficulty with the seal of the mask. She would be willing to get re-evaluated and consider PAP therapy again. She is working on weight loss and started Ozempic  about one month ago. She brought her old PAP machine, an older ResMed model, but a compliance download was not possible.   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  She tries to be in bed before 9  PM.  She lives with her husband and 64 year old son, she also has 3 grown children.  She has 1 small dog in the household, the dog does not typically sleep in the bedroom with them.  She does not have a TV in her bedroom.  She denies nightly nocturia or recurrent morning headaches.  She is not aware of any family history of sleep apnea.  She drinks caffeine not daily, no alcohol currently, smokes about a pack per day and is not actively working on smoking cessation currently.   REVIEW OF SYSTEMS: Out of a complete 14 system review of symptoms, the patient complains only of the following symptoms, and all other reviewed systems are negative.  See HPI  ALLERGIES: Allergies  Allergen  Reactions   Penicillins Hives and Other (See Comments)   Bee Venom Swelling   Latex Itching and Rash    HOME MEDICATIONS: Outpatient Medications Prior to Visit  Medication Sig Dispense Refill   Blood Glucose Monitoring Suppl DEVI 1 each by Does not apply route in the morning, at noon, and at bedtime. May substitute to any manufacturer covered by patient's insurance. 1 each 0   buPROPion  (WELLBUTRIN  XL) 300 MG 24 hr tablet TAKE 1 TABLET BY MOUTH EVERY DAY 90 tablet 1   Continuous Glucose Sensor (DEXCOM G7 SENSOR) MISC Use as directed per Dexcom instructions 1 each 1   cyclobenzaprine  (FLEXERIL ) 5 MG tablet Take 1 tablet (5 mg total) by mouth 3 (three) times daily as needed for muscle spasms. 30 tablet 1   gabapentin  (NEURONTIN ) 300 MG capsule TAKE 1 CAPSULE BY MOUTH TWICE A DAY 60 capsule 2   glipiZIDE  (GLUCOTROL ) 10 MG tablet TAKE 1 TABLET (10 MG TOTAL) BY MOUTH TWICE A DAY BEFORE A MEAL 180 tablet 1   hydrOXYzine  (VISTARIL ) 25 MG capsule Take 1 capsule (25 mg total) by mouth every 8 (eight) hours as needed. 30 capsule 3   Lancets (FREESTYLE) lancets USE TO TEST BLOOD SUGAR, AM, NOON & BEDTIME, 3 TIMES A DAY 100 each 1   metFORMIN  (GLUCOPHAGE ) 500 MG tablet Take 1 tablet (500 mg total) by mouth daily with breakfast. 180 tablet 3   PARoxetine  (PAXIL ) 20 MG tablet TAKE 1 TABLET BY MOUTH EVERY DAY 90 tablet 1   rosuvastatin  (CRESTOR ) 10 MG tablet Take 1 tablet (10 mg total) by mouth daily. 90 tablet 3   Semaglutide , 2 MG/DOSE, 8 MG/3ML SOPN Inject 2 mg as directed once a week. 3 mL 2   chlorhexidine  (PERIDEX ) 0.12 % solution Use as directed 15 mLs in the mouth or throat 2 (two) times daily. (Patient not taking: Reported on 05/10/2024) 120 mL 0   clindamycin  (CLEOCIN ) 300 MG capsule Take 1 capsule (300 mg total) by mouth 2 (two) times daily. (Patient not taking: Reported on 11/25/2023) 14 capsule 0   fluticasone  (FLONASE  ALLERGY RELIEF) 50 MCG/ACT nasal spray Place 2 sprays into both nostrils daily.  15.8 mL 0   lidocaine  (XYLOCAINE ) 2 % solution Use as directed 10 mLs in the mouth or throat every 3 (three) hours as needed. 100 mL 0   No facility-administered medications prior to visit.    PAST MEDICAL HISTORY: Past Medical History:  Diagnosis Date   Anxiety    Asthma    Back pain    Chest pain    Constipation    Depression    Diabetes mellitus without complication (HCC)    GERD (gastroesophageal reflux disease)    High blood pressure  High cholesterol    Joint pain    Sleep apnea    Swelling of lower extremity    Tobacco abuse    Vitamin D  deficiency     PAST SURGICAL HISTORY: Past Surgical History:  Procedure Laterality Date   APPENDECTOMY     CHOLECYSTECTOMY     TUBAL LIGATION      FAMILY HISTORY: Family History  Problem Relation Age of Onset   Heart defect Mother    Hypertension Mother    High Cholesterol Mother    Depression Mother    Anxiety disorder Mother    Diabetes Father    High Cholesterol Father    Sleep apnea Father    Hypertension Maternal Grandmother    Heart attack Maternal Grandfather    Breast cancer Other     SOCIAL HISTORY: Social History   Socioeconomic History   Marital status: Married    Spouse name: Not on file   Number of children: Not on file   Years of education: Not on file   Highest education level: Some college, no degree  Occupational History   Not on file  Tobacco Use   Smoking status: Every Day    Current packs/day: 1.00    Types: Cigarettes   Smokeless tobacco: Never  Substance and Sexual Activity   Alcohol use: No   Drug use: No   Sexual activity: Yes    Birth control/protection: Surgical  Other Topics Concern   Not on file  Social History Narrative   Not on file   Social Drivers of Health   Financial Resource Strain: Low Risk  (03/28/2024)   Overall Financial Resource Strain (CARDIA)    Difficulty of Paying Living Expenses: Not very hard  Food Insecurity: No Food Insecurity (03/28/2024)   Hunger  Vital Sign    Worried About Running Out of Food in the Last Year: Never true    Ran Out of Food in the Last Year: Never true  Transportation Needs: No Transportation Needs (03/28/2024)   PRAPARE - Administrator, Civil Service (Medical): No    Lack of Transportation (Non-Medical): No  Physical Activity: Sufficiently Active (03/28/2024)   Exercise Vital Sign    Days of Exercise per Week: 3 days    Minutes of Exercise per Session: 60 min  Stress: Stress Concern Present (03/28/2024)   [employer] of Occupational Health - Occupational Stress Questionnaire    Feeling of Stress: To some extent  Social Connections: Moderately Isolated (03/28/2024)   Social Connection and Isolation Panel    Frequency of Communication with Friends and Family: Twice a week    Frequency of Social Gatherings with Friends and Family: Once a week    Attends Religious Services: Never    Database administrator or Organizations: No    Attends Engineer, structural: Not on file    Marital Status: Married  Catering manager Violence: Not on file   PHYSICAL EXAM  Vitals:   05/10/24 1241  BP: 135/79  Pulse: 86  Temp: 98.9 F (37.2 C)  SpO2: 95%  Weight: 217 lb 12.8 oz (98.8 kg)   Body mass index is 38.58 kg/m.  Generalized: Well developed, in no acute distress  Neurological examination  Mentation: Alert oriented to time, place, history taking. Follows all commands speech and language fluent Cranial nerve II-XII: Pupils were equal round reactive to light.  Motor: Moves all extremities independent Gait and station: Gait is normal.   DIAGNOSTIC DATA (LABS, IMAGING, TESTING) -  I reviewed patient records, labs, notes, testing and imaging myself where available.  Lab Results  Component Value Date   WBC 8.1 03/30/2024   HGB 13.0 03/30/2024   HCT 39.6 03/30/2024   MCV 96 03/30/2024   PLT 357 03/30/2024      Component Value Date/Time   NA 139 03/30/2024 1006   K 4.0 03/30/2024 1006    CL 105 03/30/2024 1006   CO2 18 (L) 03/30/2024 1006   GLUCOSE 104 (H) 03/30/2024 1006   GLUCOSE 88 12/14/2012 1120   BUN 9 03/30/2024 1006   CREATININE 0.62 03/30/2024 1006   CREATININE 0.59 12/14/2012 1120   CALCIUM  9.0 03/30/2024 1006   PROT 7.0 11/25/2023 1027   ALBUMIN 4.2 11/25/2023 1027   AST 42 (H) 11/25/2023 1027   ALT 54 (H) 11/25/2023 1027   ALKPHOS 118 11/25/2023 1027   BILITOT 0.5 11/25/2023 1027   GFRNONAA 101 09/15/2015 0920   GFRAA 116 09/15/2015 0920   Lab Results  Component Value Date   CHOL 170 03/30/2024   HDL 40 03/30/2024   LDLCALC 112 (H) 03/30/2024   TRIG 100 03/30/2024   CHOLHDL 4.3 03/30/2024   Lab Results  Component Value Date   HGBA1C 6.6 (H) 03/30/2024   Lab Results  Component Value Date   VITAMINB12 498 06/23/2023   Lab Results  Component Value Date   TSH 1.320 11/25/2023    Lauraine Born, AGNP-C, DNP 05/10/2024, 12:56 PM [facility] [address], [address] [place], [place] [address] [phone]

## 2024-05-10 NOTE — Patient Instructions (Signed)
 I will increase your pressure setting Try to use CPAP nightly minimum 4 hours If difficulty tolerating please reach out and we can make changes Mask refit for comfort I will pull a download in 1 month, otherwise follow-up in 6 months virtually Please reach out if you need me.  Thanks!!

## 2024-05-11 ENCOUNTER — Other Ambulatory Visit: Payer: Self-pay | Admitting: Family Medicine

## 2024-05-11 DIAGNOSIS — F419 Anxiety disorder, unspecified: Secondary | ICD-10-CM

## 2024-05-12 ENCOUNTER — Other Ambulatory Visit: Payer: Self-pay | Admitting: Family Medicine

## 2024-05-12 DIAGNOSIS — E119 Type 2 diabetes mellitus without complications: Secondary | ICD-10-CM

## 2024-05-14 NOTE — Telephone Encounter (Signed)
 Pharmacy Patient Advocate Encounter  Received notification from [employer] that Prior Authorization for Dexcom G7 Sensor has been DENIED.  Full denial letter will be uploaded to the media tab. See denial reason below.   PA #/Case ID/Reference #: [id number]

## 2024-05-16 DIAGNOSIS — G4733 Obstructive sleep apnea (adult) (pediatric): Secondary | ICD-10-CM | POA: Diagnosis not present

## 2024-05-21 ENCOUNTER — Other Ambulatory Visit (HOSPITAL_BASED_OUTPATIENT_CLINIC_OR_DEPARTMENT_OTHER): Payer: Self-pay

## 2024-05-22 ENCOUNTER — Other Ambulatory Visit (HOSPITAL_BASED_OUTPATIENT_CLINIC_OR_DEPARTMENT_OTHER): Payer: Self-pay

## 2024-05-22 MED ORDER — ROSUVASTATIN CALCIUM 5 MG PO TABS
5.0000 mg | ORAL_TABLET | Freq: Every day | ORAL | 3 refills | Status: AC
  Filled 2024-05-22: qty 90, 90d supply, fill #0

## 2024-05-22 MED FILL — Gabapentin Cap 300 MG: ORAL | 30 days supply | Qty: 60 | Fill #0 | Status: CN

## 2024-05-22 MED FILL — Glipizide Tab 10 MG: ORAL | 90 days supply | Qty: 180 | Fill #0 | Status: CN

## 2024-06-01 ENCOUNTER — Other Ambulatory Visit (HOSPITAL_BASED_OUTPATIENT_CLINIC_OR_DEPARTMENT_OTHER): Payer: Self-pay

## 2024-06-04 ENCOUNTER — Encounter (INDEPENDENT_AMBULATORY_CARE_PROVIDER_SITE_OTHER): Payer: Self-pay | Admitting: *Deleted

## 2024-06-04 ENCOUNTER — Other Ambulatory Visit (HOSPITAL_BASED_OUTPATIENT_CLINIC_OR_DEPARTMENT_OTHER): Payer: Self-pay

## 2024-06-07 ENCOUNTER — Encounter: Payer: Self-pay | Admitting: Neurology

## 2024-06-08 ENCOUNTER — Other Ambulatory Visit: Payer: Self-pay | Admitting: Family Medicine

## 2024-06-24 ENCOUNTER — Encounter: Payer: Self-pay | Admitting: Family Medicine

## 2024-06-25 ENCOUNTER — Other Ambulatory Visit (HOSPITAL_BASED_OUTPATIENT_CLINIC_OR_DEPARTMENT_OTHER): Payer: Self-pay

## 2024-06-25 ENCOUNTER — Other Ambulatory Visit: Payer: Self-pay | Admitting: Family Medicine

## 2024-06-25 DIAGNOSIS — E119 Type 2 diabetes mellitus without complications: Secondary | ICD-10-CM

## 2024-06-25 MED ORDER — SEMAGLUTIDE (2 MG/DOSE) 8 MG/3ML ~~LOC~~ SOPN: 2.0000 mg | PEN_INJECTOR | SUBCUTANEOUS | 0 refills | Status: DC

## 2024-06-25 MED ORDER — DEXCOM G7 SENSOR MISC
1 refills | Status: AC
  Filled 2024-06-25 – 2024-07-18 (×3): qty 1, 10d supply, fill #0

## 2024-06-25 MED ORDER — DEXCOM G7 SENSOR MISC: 1 refills | Status: DC

## 2024-06-25 MED ORDER — SEMAGLUTIDE (2 MG/DOSE) 8 MG/3ML ~~LOC~~ SOPN
2.0000 mg | PEN_INJECTOR | SUBCUTANEOUS | 0 refills | Status: AC
  Filled 2024-06-25: qty 6, 84d supply, fill #0
  Filled 2024-06-28: qty 6, 56d supply, fill #0

## 2024-06-25 NOTE — Telephone Encounter (Signed)
 Rx sent.

## 2024-06-26 ENCOUNTER — Other Ambulatory Visit (HOSPITAL_COMMUNITY): Payer: Self-pay

## 2024-06-28 ENCOUNTER — Other Ambulatory Visit (HOSPITAL_BASED_OUTPATIENT_CLINIC_OR_DEPARTMENT_OTHER): Payer: Self-pay

## 2024-06-28 ENCOUNTER — Telehealth: Payer: Self-pay | Admitting: Family Medicine

## 2024-06-28 ENCOUNTER — Other Ambulatory Visit: Payer: Self-pay

## 2024-06-28 NOTE — Telephone Encounter (Signed)
 Copied from CRM #[id number]. Topic: Clinical - Prescription Issue >> Jun 28, 2024  4:28 PM Hadassah PARAS wrote: Reason for CRM:  Insurance denied [facility] G7 SENSOR, she was told PCP could appeal it. Please advice #[phone]

## 2024-06-29 ENCOUNTER — Other Ambulatory Visit (HOSPITAL_BASED_OUTPATIENT_CLINIC_OR_DEPARTMENT_OTHER): Payer: Self-pay

## 2024-06-29 NOTE — Telephone Encounter (Signed)
 PA request has been Denied. New Encounter has been or will be created for follow up. For additional info see Pharmacy Prior Auth telephone encounter from 05/02/2024.

## 2024-07-02 ENCOUNTER — Other Ambulatory Visit (HOSPITAL_BASED_OUTPATIENT_CLINIC_OR_DEPARTMENT_OTHER): Payer: Self-pay

## 2024-07-03 ENCOUNTER — Other Ambulatory Visit (HOSPITAL_BASED_OUTPATIENT_CLINIC_OR_DEPARTMENT_OTHER): Payer: Self-pay

## 2024-07-04 ENCOUNTER — Other Ambulatory Visit (HOSPITAL_BASED_OUTPATIENT_CLINIC_OR_DEPARTMENT_OTHER): Payer: Self-pay

## 2024-07-05 ENCOUNTER — Other Ambulatory Visit (HOSPITAL_BASED_OUTPATIENT_CLINIC_OR_DEPARTMENT_OTHER): Payer: Self-pay

## 2024-07-06 ENCOUNTER — Other Ambulatory Visit (HOSPITAL_BASED_OUTPATIENT_CLINIC_OR_DEPARTMENT_OTHER): Payer: Self-pay

## 2024-07-09 ENCOUNTER — Other Ambulatory Visit (HOSPITAL_BASED_OUTPATIENT_CLINIC_OR_DEPARTMENT_OTHER): Payer: Self-pay

## 2024-07-10 ENCOUNTER — Other Ambulatory Visit (HOSPITAL_BASED_OUTPATIENT_CLINIC_OR_DEPARTMENT_OTHER): Payer: Self-pay

## 2024-07-11 ENCOUNTER — Other Ambulatory Visit (HOSPITAL_BASED_OUTPATIENT_CLINIC_OR_DEPARTMENT_OTHER): Payer: Self-pay

## 2024-07-12 ENCOUNTER — Other Ambulatory Visit (HOSPITAL_BASED_OUTPATIENT_CLINIC_OR_DEPARTMENT_OTHER): Payer: Self-pay

## 2024-07-13 ENCOUNTER — Other Ambulatory Visit (HOSPITAL_BASED_OUTPATIENT_CLINIC_OR_DEPARTMENT_OTHER): Payer: Self-pay

## 2024-07-16 ENCOUNTER — Other Ambulatory Visit (HOSPITAL_BASED_OUTPATIENT_CLINIC_OR_DEPARTMENT_OTHER): Payer: Self-pay

## 2024-07-17 ENCOUNTER — Other Ambulatory Visit (HOSPITAL_BASED_OUTPATIENT_CLINIC_OR_DEPARTMENT_OTHER): Payer: Self-pay

## 2024-07-18 ENCOUNTER — Other Ambulatory Visit (HOSPITAL_BASED_OUTPATIENT_CLINIC_OR_DEPARTMENT_OTHER): Payer: Self-pay

## 2024-07-19 ENCOUNTER — Other Ambulatory Visit (HOSPITAL_BASED_OUTPATIENT_CLINIC_OR_DEPARTMENT_OTHER): Payer: Self-pay

## 2024-07-20 ENCOUNTER — Other Ambulatory Visit (HOSPITAL_BASED_OUTPATIENT_CLINIC_OR_DEPARTMENT_OTHER): Payer: Self-pay

## 2024-07-23 ENCOUNTER — Other Ambulatory Visit (HOSPITAL_BASED_OUTPATIENT_CLINIC_OR_DEPARTMENT_OTHER): Payer: Self-pay

## 2024-07-25 ENCOUNTER — Other Ambulatory Visit (HOSPITAL_BASED_OUTPATIENT_CLINIC_OR_DEPARTMENT_OTHER): Payer: Self-pay

## 2024-07-27 ENCOUNTER — Other Ambulatory Visit (HOSPITAL_BASED_OUTPATIENT_CLINIC_OR_DEPARTMENT_OTHER): Payer: Self-pay

## 2024-07-30 ENCOUNTER — Other Ambulatory Visit (HOSPITAL_BASED_OUTPATIENT_CLINIC_OR_DEPARTMENT_OTHER): Payer: Self-pay

## 2024-07-31 ENCOUNTER — Other Ambulatory Visit (HOSPITAL_BASED_OUTPATIENT_CLINIC_OR_DEPARTMENT_OTHER): Payer: Self-pay

## 2024-08-01 ENCOUNTER — Other Ambulatory Visit (HOSPITAL_BASED_OUTPATIENT_CLINIC_OR_DEPARTMENT_OTHER): Payer: Self-pay

## 2024-08-03 ENCOUNTER — Other Ambulatory Visit (HOSPITAL_BASED_OUTPATIENT_CLINIC_OR_DEPARTMENT_OTHER): Payer: Self-pay

## 2024-08-04 ENCOUNTER — Other Ambulatory Visit (HOSPITAL_BASED_OUTPATIENT_CLINIC_OR_DEPARTMENT_OTHER): Payer: Self-pay

## 2024-08-06 ENCOUNTER — Other Ambulatory Visit (HOSPITAL_BASED_OUTPATIENT_CLINIC_OR_DEPARTMENT_OTHER): Payer: Self-pay

## 2024-08-07 ENCOUNTER — Other Ambulatory Visit (HOSPITAL_BASED_OUTPATIENT_CLINIC_OR_DEPARTMENT_OTHER): Payer: Self-pay

## 2024-08-08 ENCOUNTER — Other Ambulatory Visit (HOSPITAL_BASED_OUTPATIENT_CLINIC_OR_DEPARTMENT_OTHER): Payer: Self-pay

## 2024-08-09 ENCOUNTER — Ambulatory Visit: Admitting: Family Medicine

## 2024-08-09 VITALS — BP 132/79 | HR 84 | Ht 63.0 in | Wt 221.0 lb

## 2024-08-09 DIAGNOSIS — Z720 Tobacco use: Secondary | ICD-10-CM

## 2024-08-09 DIAGNOSIS — E038 Other specified hypothyroidism: Secondary | ICD-10-CM | POA: Diagnosis not present

## 2024-08-09 DIAGNOSIS — E782 Mixed hyperlipidemia: Secondary | ICD-10-CM

## 2024-08-09 DIAGNOSIS — E1165 Type 2 diabetes mellitus with hyperglycemia: Secondary | ICD-10-CM

## 2024-08-09 DIAGNOSIS — R7301 Impaired fasting glucose: Secondary | ICD-10-CM

## 2024-08-09 DIAGNOSIS — E559 Vitamin D deficiency, unspecified: Secondary | ICD-10-CM

## 2024-08-09 NOTE — Patient Instructions (Signed)
 I appreciate the opportunity to provide care to you today!    Follow up:  5 months  Fasting Labs: please stop by the lab during the week to get your blood drawn (CBC, CMP, TSH, Lipid profile, HgA1c, Vit D)  For a Healthier YOU, I Recommend: Reducing your intake of sugar, sodium, carbohydrates, and saturated fats. Increasing your fiber intake by incorporating more whole grains, fruits, and vegetables into your meals. Setting healthy goals with a focus on lowering your consumption of carbs, sugar, and unhealthy fats. Adding variety to your diet by including a wide range of fruits and vegetables. Cutting back on soda and limiting processed foods as much as possible. Staying active: In addition to taking your weight loss medication, aim for at least 150 minutes of moderate-intensity physical activity each week for optimal results.   Please follow up if your symptoms worsen or fail to improve.   Please continue to a heart-healthy diet and increase your physical activities. Try to exercise for at least five days a week.    It was a pleasure to see you and I look forward to continuing to work together on your health and well-being. Please do not hesitate to call the office if you need care or have questions about your care.  In case of emergency, please visit the Emergency Department for urgent care, or contact our clinic at [phone] to schedule an appointment. We're here to help you!   Have a wonderful day and week. With Gratitude, Meade JENEANE Gerlach MSN, FNP-BC, PMHNP-BC

## 2024-08-09 NOTE — Assessment & Plan Note (Signed)
 The patient reports smoking  to  pack of cigarettes daily. Smoking cessation was encouraged, and the patient verbalized understanding.

## 2024-08-09 NOTE — Assessment & Plan Note (Deleted)
 Stable encouraged to continue treatment regimen as is will assess hemoglobin A1c today Lab Results  Component Value Date   HGBA1C 6.6 (H) 03/30/2024

## 2024-08-09 NOTE — Assessment & Plan Note (Addendum)
 Stable. The patient is encouraged to continue the current treatment regimen as prescribed. A hemoglobin A1c will be assessed today. Lab Results  Component Value Date   HGBA1C 6.6 (H) 03/30/2024

## 2024-08-09 NOTE — Progress Notes (Signed)
 Established Patient Office Visit  Subjective:  Patient ID: Vanessa Shelton, female    DOB: 02-06-1976  Age: 48 y.o. MRN: [id number]  CC:  Chief Complaint  Patient presents with   Medical Management of Chronic Issues    Follow up     HPI Vanessa Shelton is a 48 y.o. female with past medical history of T2DM, Vit d def, hyperlipidemia presents for f/u of  chronic medical conditions. For the details of today's visit, please refer to the assessment and plan.     Past Medical History:  Diagnosis Date   Anxiety    Asthma    Back pain    Chest pain    Constipation    Depression    Diabetes mellitus without complication (HCC)    GERD (gastroesophageal reflux disease)    High blood pressure    High cholesterol    Joint pain    Sleep apnea    Swelling of lower extremity    Tobacco abuse    Vitamin D  deficiency     Past Surgical History:  Procedure Laterality Date   APPENDECTOMY     CHOLECYSTECTOMY     TUBAL LIGATION      Family History  Problem Relation Age of Onset   Heart defect Mother    Hypertension Mother    High Cholesterol Mother    Depression Mother    Anxiety disorder Mother    Diabetes Father    High Cholesterol Father    Sleep apnea Father    Hypertension Maternal Grandmother    Heart attack Maternal Grandfather    Breast cancer Other     Social History   Socioeconomic History   Marital status: Married    Spouse name: Not on file   Number of children: Not on file   Years of education: Not on file   Highest education level: Some college, no degree  Occupational History   Not on file  Tobacco Use   Smoking status: Every Day    Current packs/day: 1.00    Types: Cigarettes   Smokeless tobacco: Never  Substance and Sexual Activity   Alcohol use: No   Drug use: No   Sexual activity: Yes    Birth control/protection: Surgical  Other Topics Concern   Not on file  Social History Narrative   Not on file   Social Drivers of Health   Financial  Resource Strain: Low Risk  (08/09/2024)   Overall Financial Resource Strain (CARDIA)    Difficulty of Paying Living Expenses: Not very hard  Food Insecurity: No Food Insecurity (08/09/2024)   Hunger Vital Sign    Worried About Running Out of Food in the Last Year: Never true    Ran Out of Food in the Last Year: Never true  Transportation Needs: No Transportation Needs (08/09/2024)   PRAPARE - Administrator, Civil Service (Medical): No    Lack of Transportation (Non-Medical): No  Physical Activity: Insufficiently Active (08/09/2024)   Exercise Vital Sign    Days of Exercise per Week: 2 days    Minutes of Exercise per Session: 20 min  Stress: Stress Concern Present (08/09/2024)   Vanessa Shelton of Occupational Health - Occupational Stress Questionnaire    Feeling of Stress: Rather much  Social Connections: Moderately Isolated (08/09/2024)   Social Connection and Isolation Panel    Frequency of Communication with Friends and Family: Twice a week    Frequency of Social Gatherings with Friends and Family:  Once a week    Attends Religious Services: Never    Active Member of Clubs or Organizations: No    Attends Engineer, Structural: Not on file    Marital Status: Married  Catering Manager Violence: Not on file    Outpatient Medications Prior to Visit  Medication Sig Dispense Refill   Blood Glucose Monitoring Suppl DEVI 1 each by Does not apply route in the morning, at noon, and at bedtime. May substitute to any manufacturer covered by patient's insurance. 1 each 0   buPROPion  (WELLBUTRIN  XL) 300 MG 24 hr tablet TAKE 1 TABLET BY MOUTH EVERY DAY 90 tablet 1   chlorhexidine  (PERIDEX ) 0.12 % solution Use as directed 15 mLs in the mouth or throat 2 (two) times daily. (Patient not taking: Reported on 05/10/2024) 120 mL 0   Continuous Glucose Sensor (DEXCOM G7 SENSOR) MISC Use as directed per Dexcom instructions, changing sensors every 10 days. 1 each 1   cyclobenzaprine   (FLEXERIL ) 5 MG tablet Take 1 tablet (5 mg total) by mouth 3 (three) times daily as needed for muscle spasms. 30 tablet 1   gabapentin  (NEURONTIN ) 300 MG capsule TAKE 1 CAPSULE BY MOUTH TWICE A DAY 60 capsule 2   glipiZIDE  (GLUCOTROL ) 10 MG tablet TAKE 1 TABLET (10 MG TOTAL) BY MOUTH TWICE A DAY BEFORE A MEAL 180 tablet 1   hydrOXYzine  (VISTARIL ) 25 MG capsule Take 1 capsule (25 mg total) by mouth every 8 (eight) hours as needed. 30 capsule 3   Lancets (FREESTYLE) lancets USE TO TEST BLOOD SUGAR, AM, NOON & BEDTIME, 3 TIMES A DAY 100 each 1   metFORMIN  (GLUCOPHAGE ) 500 MG tablet TAKE 1 TABLET BY MOUTH EVERY DAY WITH BREAKFAST 90 tablet 7   PARoxetine  (PAXIL ) 20 MG tablet TAKE 1 TABLET BY MOUTH EVERY DAY 90 tablet 1   rosuvastatin  (CRESTOR ) 10 MG tablet Take 1 tablet (10 mg total) by mouth daily. 90 tablet 3   rosuvastatin  (CRESTOR ) 5 MG tablet Take 1 tablet (5 mg total) by mouth daily. 90 tablet 3   Semaglutide , 2 MG/DOSE, 8 MG/3ML SOPN Inject 2 mg as directed once a week. 6 mL 0   No facility-administered medications prior to visit.    Allergies  Allergen Reactions   Penicillins Hives and Other (See Comments)   Bee Venom Swelling   Latex Itching and Rash    ROS Review of Systems  Constitutional:  Negative for chills and fever.  Eyes:  Negative for visual disturbance.  Respiratory:  Negative for chest tightness and shortness of breath.   Neurological:  Negative for dizziness and headaches.      Objective:    Physical Exam HENT:     Head: Normocephalic.     Mouth/Throat:     Mouth: Mucous membranes are moist.  Cardiovascular:     Rate and Rhythm: Normal rate.     Heart sounds: Normal heart sounds.  Pulmonary:     Effort: Pulmonary effort is normal.     Breath sounds: Normal breath sounds.  Neurological:     Mental Status: She is alert.     BP 132/79   Pulse 84   Ht 5' 3 (1.6 m)   Wt 221 lb (100.2 kg)   SpO2 93%   BMI 39.15 kg/m  Wt Readings from Last 3  Encounters:  08/09/24 221 lb (100.2 kg)  05/10/24 217 lb 12.8 oz (98.8 kg)  03/30/24 218 lb (98.9 kg)    Lab Results  Component Value  Date   TSH 1.320 11/25/2023   Lab Results  Component Value Date   WBC 8.1 03/30/2024   HGB 13.0 03/30/2024   HCT 39.6 03/30/2024   MCV 96 03/30/2024   PLT 357 03/30/2024   Lab Results  Component Value Date   NA 139 03/30/2024   K 4.0 03/30/2024   CO2 18 (L) 03/30/2024   GLUCOSE 104 (H) 03/30/2024   BUN 9 03/30/2024   CREATININE 0.62 03/30/2024   BILITOT 0.5 11/25/2023   ALKPHOS 118 11/25/2023   AST 42 (H) 11/25/2023   ALT 54 (H) 11/25/2023   PROT 7.0 11/25/2023   ALBUMIN 4.2 11/25/2023   CALCIUM  9.0 03/30/2024   EGFR 112 03/30/2024   Lab Results  Component Value Date   CHOL 170 03/30/2024   Lab Results  Component Value Date   HDL 40 03/30/2024   Lab Results  Component Value Date   LDLCALC 112 (H) 03/30/2024   Lab Results  Component Value Date   TRIG 100 03/30/2024   Lab Results  Component Value Date   CHOLHDL 4.3 03/30/2024   Lab Results  Component Value Date   HGBA1C 6.6 (H) 03/30/2024      Assessment & Plan:  Type 2 diabetes mellitus with hyperglycemia, without long-term current use of insulin  (HCC) Assessment & Plan: Stable. The patient is encouraged to continue the current treatment regimen as prescribed. A hemoglobin A1c will be assessed today. Lab Results  Component Value Date   HGBA1C 6.6 (H) 03/30/2024       Tobacco abuse Assessment & Plan: The patient reports smoking  to  pack of cigarettes daily. Smoking cessation was encouraged, and the patient verbalized understanding.    IFG (impaired fasting glucose) -     Hemoglobin A1c  Vitamin D  deficiency -     VITAMIN D  25 Hydroxy (Vit-D Deficiency, Fractures)  TSH (thyroid-stimulating hormone deficiency) -     TSH + free T4  Mixed hyperlipidemia -     Lipid panel -     CMP14+EGFR -     CBC with Differential/Platelet   Note: This chart  has been completed using Engineer, Civil (consulting) software, and while attempts have been made to ensure accuracy, certain words and phrases may not be transcribed as intended.   Follow-up: Return in about 5 months (around 01/07/2025).   Skyelyn Scruggs  Z Bacchus, FNP

## 2024-08-11 ENCOUNTER — Other Ambulatory Visit (HOSPITAL_BASED_OUTPATIENT_CLINIC_OR_DEPARTMENT_OTHER): Payer: Self-pay

## 2024-08-13 ENCOUNTER — Other Ambulatory Visit (HOSPITAL_BASED_OUTPATIENT_CLINIC_OR_DEPARTMENT_OTHER): Payer: Self-pay

## 2024-08-14 ENCOUNTER — Other Ambulatory Visit (HOSPITAL_BASED_OUTPATIENT_CLINIC_OR_DEPARTMENT_OTHER): Payer: Self-pay

## 2024-08-15 ENCOUNTER — Other Ambulatory Visit (HOSPITAL_BASED_OUTPATIENT_CLINIC_OR_DEPARTMENT_OTHER): Payer: Self-pay

## 2024-08-15 ENCOUNTER — Other Ambulatory Visit: Payer: Self-pay | Admitting: Family Medicine

## 2024-08-15 ENCOUNTER — Telehealth (HOSPITAL_BASED_OUTPATIENT_CLINIC_OR_DEPARTMENT_OTHER): Payer: Self-pay

## 2024-08-15 ENCOUNTER — Other Ambulatory Visit (HOSPITAL_COMMUNITY): Payer: Self-pay

## 2024-08-15 DIAGNOSIS — E119 Type 2 diabetes mellitus without complications: Secondary | ICD-10-CM

## 2024-08-15 NOTE — Telephone Encounter (Signed)
 Pharmacy Patient Advocate Encounter   Received notification from Pt Calls Messages that prior authorization for Dexcom G7 Sensor  is required/requested.   Insurance verification completed.   The patient is insured through [employer].   Per test claim: PA required; PA submitted to above mentioned insurance via Latent Key/confirmation #/EOC [id number] Status is pending

## 2024-08-15 NOTE — Telephone Encounter (Signed)
 Medication: Dexcom G7 Sensor Able to fill? No Prior authorization required? No Co-pay before assistance: N/A

## 2024-08-15 NOTE — Telephone Encounter (Signed)
 PA request has been Received. New Encounter has been or will be created for follow up. For additional info see Pharmacy Prior Auth telephone encounter from 08/15/24.

## 2024-08-16 ENCOUNTER — Other Ambulatory Visit (HOSPITAL_BASED_OUTPATIENT_CLINIC_OR_DEPARTMENT_OTHER): Payer: Self-pay

## 2024-08-16 NOTE — Telephone Encounter (Signed)
 Pharmacy Patient Advocate Encounter  Received notification from [employer] that Prior Authorization for  Dexcom G7 Sensor  has been DENIED.  See denial reason below. No denial letter attached in CMM. Will attach denial letter to Media tab once received.   PA #/Case ID/Reference #: [id number]

## 2024-08-17 ENCOUNTER — Other Ambulatory Visit (HOSPITAL_BASED_OUTPATIENT_CLINIC_OR_DEPARTMENT_OTHER): Payer: Self-pay

## 2024-08-20 ENCOUNTER — Other Ambulatory Visit (HOSPITAL_BASED_OUTPATIENT_CLINIC_OR_DEPARTMENT_OTHER): Payer: Self-pay

## 2024-08-21 ENCOUNTER — Other Ambulatory Visit (HOSPITAL_BASED_OUTPATIENT_CLINIC_OR_DEPARTMENT_OTHER): Payer: Self-pay

## 2024-08-22 ENCOUNTER — Other Ambulatory Visit (HOSPITAL_BASED_OUTPATIENT_CLINIC_OR_DEPARTMENT_OTHER): Payer: Self-pay

## 2024-08-23 ENCOUNTER — Other Ambulatory Visit (HOSPITAL_BASED_OUTPATIENT_CLINIC_OR_DEPARTMENT_OTHER): Payer: Self-pay

## 2024-08-24 ENCOUNTER — Other Ambulatory Visit (HOSPITAL_BASED_OUTPATIENT_CLINIC_OR_DEPARTMENT_OTHER): Payer: Self-pay

## 2024-08-27 ENCOUNTER — Other Ambulatory Visit (HOSPITAL_BASED_OUTPATIENT_CLINIC_OR_DEPARTMENT_OTHER): Payer: Self-pay

## 2024-08-28 ENCOUNTER — Other Ambulatory Visit (HOSPITAL_BASED_OUTPATIENT_CLINIC_OR_DEPARTMENT_OTHER): Payer: Self-pay

## 2024-08-29 ENCOUNTER — Other Ambulatory Visit (HOSPITAL_BASED_OUTPATIENT_CLINIC_OR_DEPARTMENT_OTHER): Payer: Self-pay

## 2024-08-31 ENCOUNTER — Other Ambulatory Visit (HOSPITAL_BASED_OUTPATIENT_CLINIC_OR_DEPARTMENT_OTHER): Payer: Self-pay

## 2024-09-03 ENCOUNTER — Other Ambulatory Visit: Payer: Self-pay | Admitting: Family Medicine

## 2024-09-03 ENCOUNTER — Other Ambulatory Visit (HOSPITAL_BASED_OUTPATIENT_CLINIC_OR_DEPARTMENT_OTHER): Payer: Self-pay

## 2024-09-03 DIAGNOSIS — E119 Type 2 diabetes mellitus without complications: Secondary | ICD-10-CM

## 2024-09-04 ENCOUNTER — Other Ambulatory Visit (HOSPITAL_BASED_OUTPATIENT_CLINIC_OR_DEPARTMENT_OTHER): Payer: Self-pay

## 2024-09-05 ENCOUNTER — Other Ambulatory Visit (HOSPITAL_BASED_OUTPATIENT_CLINIC_OR_DEPARTMENT_OTHER): Payer: Self-pay

## 2024-09-07 ENCOUNTER — Other Ambulatory Visit (HOSPITAL_BASED_OUTPATIENT_CLINIC_OR_DEPARTMENT_OTHER): Payer: Self-pay

## 2024-09-10 ENCOUNTER — Other Ambulatory Visit (HOSPITAL_BASED_OUTPATIENT_CLINIC_OR_DEPARTMENT_OTHER): Payer: Self-pay

## 2024-09-11 ENCOUNTER — Other Ambulatory Visit (HOSPITAL_BASED_OUTPATIENT_CLINIC_OR_DEPARTMENT_OTHER): Payer: Self-pay

## 2024-09-14 ENCOUNTER — Other Ambulatory Visit (HOSPITAL_BASED_OUTPATIENT_CLINIC_OR_DEPARTMENT_OTHER): Payer: Self-pay

## 2024-09-17 ENCOUNTER — Other Ambulatory Visit (HOSPITAL_BASED_OUTPATIENT_CLINIC_OR_DEPARTMENT_OTHER): Payer: Self-pay

## 2024-11-28 ENCOUNTER — Telehealth: Admitting: Neurology
